# Patient Record
Sex: Female | Born: 1985
Health system: Southern US, Community
[De-identification: ages and names within clinical notes are randomized; demographics above are authoritative.]

## PROBLEM LIST (undated history)

## (undated) DIAGNOSIS — G43909 Migraine, unspecified, not intractable, without status migrainosus: Secondary | ICD-10-CM

## (undated) DIAGNOSIS — Z91010 Allergy to peanuts: Secondary | ICD-10-CM

## (undated) DIAGNOSIS — G932 Benign intracranial hypertension: Secondary | ICD-10-CM

## (undated) DIAGNOSIS — A749 Chlamydial infection, unspecified: Secondary | ICD-10-CM

## (undated) DIAGNOSIS — E669 Obesity, unspecified: Secondary | ICD-10-CM

## (undated) DIAGNOSIS — I10 Essential (primary) hypertension: Secondary | ICD-10-CM

## (undated) DIAGNOSIS — Z22322 Carrier or suspected carrier of Methicillin resistant Staphylococcus aureus: Secondary | ICD-10-CM

## (undated) DIAGNOSIS — IMO0002 Reserved for concepts with insufficient information to code with codable children: Secondary | ICD-10-CM

## (undated) DIAGNOSIS — R87619 Unspecified abnormal cytological findings in specimens from cervix uteri: Secondary | ICD-10-CM

## (undated) HISTORY — DX: Carrier or suspected carrier of methicillin resistant Staphylococcus aureus: Z22.322

## (undated) HISTORY — DX: Chlamydial infection, unspecified: A74.9

## (undated) HISTORY — DX: Obesity, unspecified: E66.9

## (undated) HISTORY — DX: Reserved for concepts with insufficient information to code with codable children: IMO0002

## (undated) HISTORY — DX: Benign intracranial hypertension: G93.2

## (undated) HISTORY — DX: Unspecified abnormal cytological findings in specimens from cervix uteri: R87.619

## (undated) HISTORY — DX: Migraine, unspecified, not intractable, without status migrainosus: G43.909

---
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
## 2007-05-05 ENCOUNTER — Emergency Department (HOSPITAL_COMMUNITY): Admission: EM | Admit: 2007-05-05 | Discharge: 2007-05-05 | Payer: Self-pay | Admitting: Emergency Medicine

## 2007-05-05 DIAGNOSIS — I10 Essential (primary) hypertension: Secondary | ICD-10-CM

## 2009-06-09 ENCOUNTER — Emergency Department (HOSPITAL_COMMUNITY): Admission: EM | Admit: 2009-06-09 | Discharge: 2009-06-09 | Payer: Self-pay | Admitting: Family Medicine

## 2009-08-29 ENCOUNTER — Emergency Department (HOSPITAL_COMMUNITY): Admission: EM | Admit: 2009-08-29 | Discharge: 2009-08-29 | Payer: Self-pay | Admitting: Emergency Medicine

## 2010-01-16 DIAGNOSIS — A749 Chlamydial infection, unspecified: Secondary | ICD-10-CM

## 2010-01-16 HISTORY — DX: Chlamydial infection, unspecified: A74.9

## 2010-04-01 LAB — POCT URINALYSIS DIPSTICK
Hgb urine dipstick: NEGATIVE
Ketones, ur: NEGATIVE mg/dL
Protein, ur: 30 mg/dL — AB
Specific Gravity, Urine: 1.02 (ref 1.005–1.030)
Urobilinogen, UA: 1 mg/dL (ref 0.0–1.0)
pH: 7.5 (ref 5.0–8.0)

## 2010-04-01 LAB — POCT PREGNANCY, URINE: Preg Test, Ur: NEGATIVE

## 2010-04-04 LAB — CULTURE, ROUTINE-ABSCESS

## 2011-04-23 ENCOUNTER — Ambulatory Visit (INDEPENDENT_AMBULATORY_CARE_PROVIDER_SITE_OTHER): Payer: BC Managed Care – PPO | Admitting: Family Medicine

## 2011-04-23 VITALS — BP 126/85 | HR 78 | Temp 98.1°F | Resp 16 | Ht 63.75 in | Wt 232.2 lb

## 2011-04-23 DIAGNOSIS — M79609 Pain in unspecified limb: Secondary | ICD-10-CM

## 2011-04-23 DIAGNOSIS — A4902 Methicillin resistant Staphylococcus aureus infection, unspecified site: Secondary | ICD-10-CM | POA: Insufficient documentation

## 2011-04-23 DIAGNOSIS — L0390 Cellulitis, unspecified: Secondary | ICD-10-CM

## 2011-04-23 DIAGNOSIS — M79659 Pain in unspecified thigh: Secondary | ICD-10-CM

## 2011-04-23 DIAGNOSIS — L0291 Cutaneous abscess, unspecified: Secondary | ICD-10-CM

## 2011-04-23 MED ORDER — DOXYCYCLINE HYCLATE 100 MG PO TABS: 100.0000 mg | ORAL_TABLET | Freq: Two times a day (BID) | ORAL | Status: AC

## 2011-04-23 NOTE — Progress Notes (Signed)
  Subjective:    Patient ID: Karen Ashley, female    DOB: July 01, 1985, 26 y.o.   MRN: [id number]  HPI 26 yo female with cyst on inner thigh for 5 days.  History of cyst/boil under right arm 2 years ago.  Told was not MRSA.  Older sister with one recently that was MRSA.  Started draining Thursday.  Using a cream on it that she got with last boil - iccanthamol.  Painful.  Feels induration is worsening.  Had fever Friday night -103.  Felt well since.    Review of Systems    Negative except as per HPI  Objective:   Physical Exam  Constitutional: She appears well-developed.  Pulmonary/Chest: Effort normal.  Neurological: She is alert.   Left, upper, inner thigh: small opening draining purulent material.  Induration extends up/out about 4-5 cm.  About 1-2 cm wide.  Erythema extends beyond that.  TTP.  Wound culture obtained of draining material.  No inguinal lymphadenopathy appreciated.        Assessment & Plan:  Abscess with cellulitis - given fever, drainage, and very small size of opening, opted to I&D here today.  Doxy BID for 10 days.  Wound culture sent.

## 2011-04-23 NOTE — Progress Notes (Signed)
  Subjective:    Patient ID: Karen Ashley, female    DOB: 1985-05-20, 26 y.o.   MRN: [id number]  HPI    Review of Systems     Objective:   Physical Exam   Procedure Note:  VCO.  Left thigh wound area cleansed with betadine and alcohol.  4 cc of 1% lidocaine with epinephrine injected into lesion.  #11 blade used to open and bloody purulence and small amount of sebaceous material expressed.  Area of induration 10 cm by 5 cm in size.  1/4 inch packing placed and gauze and tegaderm dressing applied.  Wound instructions given.  RTC in 48 hours to see Kennedy Bucker for wound check.     Assessment & Plan:

## 2011-04-24 ENCOUNTER — Ambulatory Visit (INDEPENDENT_AMBULATORY_CARE_PROVIDER_SITE_OTHER): Payer: BC Managed Care – PPO | Admitting: Internal Medicine

## 2011-04-24 VITALS — BP 130/85 | HR 73 | Temp 98.2°F | Resp 16 | Ht 64.0 in | Wt 232.0 lb

## 2011-04-24 DIAGNOSIS — L0292 Furuncle, unspecified: Secondary | ICD-10-CM

## 2011-04-24 DIAGNOSIS — L0291 Cutaneous abscess, unspecified: Secondary | ICD-10-CM

## 2011-04-24 MED ORDER — HYDROCODONE-ACETAMINOPHEN 5-500 MG PO TABS: 1.0000 | ORAL_TABLET | Freq: Four times a day (QID) | ORAL | Status: AC | PRN

## 2011-04-24 NOTE — Progress Notes (Signed)
  Subjective:    Patient ID: Karen Ashley, female    DOB: 07/19/1985, 26 y.o.   MRN: [id number]  HPIReturns for wound care/lots of bleeding last night that she wants rechecked/still in pain/no fever    Review of Systems     Objective:   Physical Exam  Left inner thigh abscess with area of cellulitis that is tender approximately 3 cm x 5 cm/pus still expressible/drained 4 cc of pus/repacked with quarter-inch iodoform      Assessment & Plan:  Abscess thigh  Continue doxycycline Increase hot soaks to 3 or 4 times a day Out of work one to 2 days Vicodin 5 500 if needed #20 Recheck 48 hours if not progressing Awaiting culture

## 2011-04-25 ENCOUNTER — Emergency Department (HOSPITAL_COMMUNITY)
Admission: EM | Admit: 2011-04-25 | Discharge: 2011-04-25 | Disposition: A | Discharge status: Home / Self Care | Payer: BC Managed Care – PPO | Attending: Emergency Medicine | Admitting: Emergency Medicine

## 2011-04-25 ENCOUNTER — Encounter (HOSPITAL_COMMUNITY): Payer: Self-pay | Admitting: *Deleted

## 2011-04-25 DIAGNOSIS — F17200 Nicotine dependence, unspecified, uncomplicated: Secondary | ICD-10-CM | POA: Insufficient documentation

## 2011-04-25 DIAGNOSIS — I10 Essential (primary) hypertension: Secondary | ICD-10-CM | POA: Insufficient documentation

## 2011-04-25 DIAGNOSIS — L03119 Cellulitis of unspecified part of limb: Secondary | ICD-10-CM | POA: Insufficient documentation

## 2011-04-25 DIAGNOSIS — L02419 Cutaneous abscess of limb, unspecified: Secondary | ICD-10-CM | POA: Insufficient documentation

## 2011-04-25 HISTORY — DX: Essential (primary) hypertension: I10

## 2011-04-25 LAB — WOUND CULTURE
Gram Stain: NONE SEEN
Gram Stain: NONE SEEN

## 2011-04-25 NOTE — ED Provider Notes (Signed)
History     CSN: [id number]  Arrival date & time 04/25/11  0807   First MD Initiated Contact with Patient 04/25/11 [phone]      Chief Complaint  Patient presents with  . Leg Pain  . Skin Problem     Patient is a 26 y.o. female presenting with leg pain.  Leg Pain    Patient presents emergency department for evaluation of an abscess.  States she had it drained yesterday at [facility] urgent care.  She states that she feels like the area around the abscesse is more firm at this time.  She states that they gave her antibiotics and pain medication.  He is worried that she may have a medical problem that could kill her and that is what brought her to the emergency department today.  She states that she has had an abscess under her armpit before.  She is concerned that there something else causing these may be harmful to her.  Patient denies weakness, nausea/vomiting, abdominal pain, altered mental status or fevers. Past Medical History  Diagnosis Date  . Hypertension     History reviewed. No pertinent past surgical history.  No family history on file.  History  Substance Use Topics  . Smoking status: Current Some Day Smoker    Last Attempt to Quit: 02/23/2011  . Smokeless tobacco: Not on file  . Alcohol Use: Yes    OB History    Grav Para Term Preterm Abortions TAB SAB Ect Mult Living                  Review of Systems All pertinent positives and negatives reviewed in the history of present illness  Allergies  Review of patient's allergies indicates no known allergies.  Home Medications   Current Outpatient Rx  Name Route Sig Dispense Refill  . AMLODIPINE BESYLATE 5 MG PO TABS Oral Take 5 mg by mouth daily.    Marland Kitchen DOXYCYCLINE HYCLATE 100 MG PO TABS Oral Take 1 tablet (100 mg total) by mouth 2 (two) times daily. 20 tablet 0  . HYDROCODONE-ACETAMINOPHEN 5-500 MG PO TABS Oral Take 1 tablet by mouth every 6 (six) hours as needed for pain. 20 tablet 0    BP 147/108  Pulse 78   Temp(Src) 98.6 F (37 C) (Oral)  Resp 18  Ht 5\' 2"  (1.575 m)  Wt 232 lb (105.235 kg)  BMI 42.43 kg/m2  SpO2 100%  LMP 03/10/2011  Physical Exam  Constitutional: She appears well-developed and well-nourished. She is cooperative.  Non-toxic appearance. She does not have a sickly appearance. She does not appear ill.  HENT:  Head: Normocephalic and atraumatic.  Cardiovascular: Normal rate, regular rhythm and normal heart sounds.   Pulmonary/Chest: Effort normal and breath sounds normal. No respiratory distress.  Musculoskeletal:       Legs: Neurological: She is alert.  Psychiatric: Her mood appears anxious.    ED Course  Procedures (including critical care time)  I advised the patient she should follow up with [facility] urgent care for recheck of this in 2 days.  I advised her that she may use warm compresses around the area and keep the area clean and dry.  Explained that these types of abscesses are common and there is not always a specific cause.  She is very anxious and upset about having had these abscesses is concerned that this could be a life-threatening issue. Patient has only a isolated skin abscess at this time that is draining and is  packed.   MDM          Carlyle Dolly, PA-C 04/25/11 [phone]

## 2011-04-25 NOTE — ED Notes (Signed)
Pt presents to department for evaluation of abscess to L inner thigh area. Ongoing x1 week. States she was seen at [facility], I&D was performed and wound was packed. Now states pain has increased. Also states "the firmness of the wound is worse." packing in place upon arrival, states red/brown colored drainage. 7/10 pain at the time. Was placed on doxycycline and vicodin, pt states no relief of pain. Ambulatory to triage. No signs of acute distress noted.

## 2011-04-25 NOTE — Discharge Instructions (Signed)
You will need to follow up with [facility] in 2 days. Return here as needed. Use heat around the area.       RESOURCE GUIDE  Dental Problems  Patients with Medicaid: [facility] [phone]                                           [phone] [address] Phone:  [phone]                                                  Phone:  [phone]  If unable to pay or uninsured, contact:  [facility] or [facility]. to become qualified for the adult dental clinic.  Chronic Pain Problems Contact [facility]  [phone] Patients need to be referred by their primary care doctor.  Insufficient Money for Medicine Contact [employer]:  call "211" or [facility] 616-410-4697.  No Primary Care Doctor Call [facility]  [phone] Other agencies that provide inexpensive medical care    [facility]  [phone]    [facility]  [phone]    [facility]  [phone]    [facility]  [phone]    [facility]  [phone]    [facility]  [phone]  Psychological Services [facility]  [phone] [facility]  [phone] [facility]   [phone] (emergency services [phone])  Substance Abuse Resources [facility]  [phone] [facility] [phone] [facility] [phone] Floydene Flock [phone] [phone] Residential & Outpatient Substance Abuse Program  332-636-4016  Abuse/Neglect [facility] Child Abuse Hotline 931-814-8160 [facility] Child Abuse Hotline (867)131-2483 (After Hours)  Emergency Shelter [facility] [phone]  Maternity Homes [facility] at the [place] [facility] [place] [phone] [facility] [phone]  MRSA Hotline #:   7697022991    [facility] Resources  [facility]     [employer]                           [facility] Dept. [address] [place]                       [address]      [address]  [place]                                                Cristobal Goldmann Phone:  [phone]                                   Phone:  [phone]                 Phone:  [phone]  [facility] Phone:  [phone]  [facility] Child Abuse Hotline 279-867-4562 [phone] (After Hours)

## 2011-04-25 NOTE — ED Notes (Signed)
Patient has a cyst or abcess on her upper left thigh.  Patient states she noticed the area last week.  She was seen by md and reports her sx are worsening.  She is on antibiotic as well

## 2011-04-25 NOTE — ED Notes (Signed)
Pt discharged. Encouraged to follow up with PCP. Had no further questions.

## 2011-04-27 ENCOUNTER — Ambulatory Visit (INDEPENDENT_AMBULATORY_CARE_PROVIDER_SITE_OTHER): Payer: BC Managed Care – PPO | Admitting: Internal Medicine

## 2011-04-27 VITALS — BP 129/84 | HR 64 | Temp 98.5°F | Resp 16 | Ht 62.0 in | Wt 229.4 lb

## 2011-04-27 DIAGNOSIS — L0390 Cellulitis, unspecified: Secondary | ICD-10-CM

## 2011-04-27 NOTE — Patient Instructions (Signed)
Your wound has MRSA (methillin resistant staph)  Continue on your doxycycline, take it after a meal to decrease nausea.  Return in 2 days for wound care.  Cellulitis Cellulitis is an infection of the skin and the tissue beneath it. The area is typically red and tender. It is caused by germs (bacteria) (usually staph or strep) that enter the body through cuts or sores. Cellulitis most commonly occurs in the arms or lower legs.  HOME CARE INSTRUCTIONS   If you are given a prescription for medications which kill germs (antibiotics), take as directed until finished.   If the infection is on the arm or leg, keep the limb elevated as able.   Use a warm cloth several times per day to relieve pain and encourage healing.   See your caregiver for recheck of the infected site as directed if problems arise.   Only take over-the-counter or prescription medicines for pain, discomfort, or fever as directed by your caregiver.  SEEK MEDICAL CARE IF:   The area of redness (inflammation) is spreading, there are red streaks coming from the infected site, or if a part of the infection begins to turn dark in color.   The joint or bone underneath the infected skin becomes painful after the skin has healed.   The infection returns in the same or another area after it seems to have gone away.   A boil or bump swells up. This may be an abscess.   New, unexplained problems such as pain or fever develop.  SEEK IMMEDIATE MEDICAL CARE IF:   You have a fever.   You or your child feels drowsy or lethargic.   There is vomiting, diarrhea, or lasting discomfort or feeling ill (malaise) with muscle aches and pains.  MAKE SURE YOU:   Understand these instructions.   Will watch your condition.   Will get help right away if you are not doing well or get worse.  Document Released: 10/12/2004 Document Revised: 12/22/2010 Document Reviewed: 08/21/2007 [facility] Patient Information 2012 [place], [place].

## 2011-04-27 NOTE — Progress Notes (Signed)
  Subjective:    Patient ID: Karen Ashley, female    DOB: 01-16-1986, 26 y.o.   MRN: [id number]  HPI  Karen Ashley is here for wound care of her left thigh cyst drained 04/23/11.  She pulled the packing out on 04/24/11 accidentally during a dressing change and came back into [facility] and Dr. Merla Riches repacked.  She feels a larger "lump" in her thigh and has been taking the doxycycline twice daily though it has upset her stomach.  She states she continues to get bloody drainage from her wound.    Review of Systems  Constitutional: Negative for fever and chills.  Eyes: Negative.   Respiratory: Negative.   Cardiovascular: Negative.   Gastrointestinal: Positive for nausea.  Skin: Positive for wound.  All other systems reviewed and are negative.  ROS negative except as noted in HIP     Objective:   Physical Exam  Vitals reviewed. Constitutional: She is oriented to person, place, and time. She appears well-developed and well-nourished.       Obese   Cardiovascular: Normal rate.   Neurological: She is alert and oriented to person, place, and time.  Skin: Skin is warm and dry.   Procedure Note:  Packing and dressing removed from left thigh which showed bloody purulent drainage.  Induration has not decreased as hoped and is tender to palpation.  Irrigated with 5 cc of 1% lidocaine but very little purulence expressed.  Repacked with a small amount of 1/4 inch packing       Assessment & Plan:  Cellulitis/Abcess:  Continue daily dressing changes and may change more frequently if draining, be careful not to pull out packing.  Continue doxy but take after a meal, avoid dairy products.  RTC in 2-3 days for wound care.

## 2011-04-27 NOTE — ED Provider Notes (Signed)
Medical screening examination/treatment/procedure(s) were performed by non-physician practitioner and as supervising physician I was immediately available for consultation/collaboration.   Suzi Roots, MD 04/27/11 [phone]

## 2011-12-06 ENCOUNTER — Emergency Department (HOSPITAL_COMMUNITY)
Admission: EM | Admit: 2011-12-06 | Discharge: 2011-12-06 | Disposition: A | Discharge status: Home / Self Care | Payer: BC Managed Care – PPO | Attending: Emergency Medicine | Admitting: Emergency Medicine

## 2011-12-06 ENCOUNTER — Encounter (HOSPITAL_COMMUNITY): Payer: Self-pay | Admitting: *Deleted

## 2011-12-06 DIAGNOSIS — R21 Rash and other nonspecific skin eruption: Secondary | ICD-10-CM | POA: Insufficient documentation

## 2011-12-06 DIAGNOSIS — R221 Localized swelling, mass and lump, neck: Secondary | ICD-10-CM | POA: Insufficient documentation

## 2011-12-06 DIAGNOSIS — L500 Allergic urticaria: Secondary | ICD-10-CM | POA: Insufficient documentation

## 2011-12-06 DIAGNOSIS — T7840XA Allergy, unspecified, initial encounter: Secondary | ICD-10-CM

## 2011-12-06 DIAGNOSIS — J3489 Other specified disorders of nose and nasal sinuses: Secondary | ICD-10-CM | POA: Insufficient documentation

## 2011-12-06 DIAGNOSIS — R220 Localized swelling, mass and lump, head: Secondary | ICD-10-CM | POA: Insufficient documentation

## 2011-12-06 DIAGNOSIS — I10 Essential (primary) hypertension: Secondary | ICD-10-CM | POA: Insufficient documentation

## 2011-12-06 DIAGNOSIS — Z91010 Allergy to peanuts: Secondary | ICD-10-CM | POA: Insufficient documentation

## 2011-12-06 DIAGNOSIS — F17200 Nicotine dependence, unspecified, uncomplicated: Secondary | ICD-10-CM | POA: Insufficient documentation

## 2011-12-06 DIAGNOSIS — H5789 Other specified disorders of eye and adnexa: Secondary | ICD-10-CM | POA: Insufficient documentation

## 2011-12-06 DIAGNOSIS — R062 Wheezing: Secondary | ICD-10-CM | POA: Insufficient documentation

## 2011-12-06 HISTORY — DX: Allergy to peanuts: Z91.010

## 2011-12-06 MED ORDER — DIPHENHYDRAMINE HCL 50 MG/ML IJ SOLN
12.5000 mg | Freq: Once | INTRAMUSCULAR | Status: AC
  Administered 2011-12-06: 12.5 mg via INTRAVENOUS
  Filled 2011-12-06: qty 1

## 2011-12-06 MED ORDER — PREDNISONE 20 MG PO TABS: 60.0000 mg | ORAL_TABLET | Freq: Every day | ORAL | Status: AC

## 2011-12-06 MED ORDER — FAMOTIDINE IN NACL 20-0.9 MG/50ML-% IV SOLN
20.0000 mg | Freq: Once | INTRAVENOUS | Status: AC
  Administered 2011-12-06: 20 mg via INTRAVENOUS
  Filled 2011-12-06: qty 50

## 2011-12-06 MED ORDER — SODIUM CHLORIDE 0.9 % IV SOLN
Freq: Once | INTRAVENOUS | Status: AC
  Administered 2011-12-06: 13:00:00 via INTRAVENOUS

## 2011-12-06 MED ORDER — DIPHENHYDRAMINE HCL 25 MG PO CAPS: 25.0000 mg | ORAL_CAPSULE | Freq: Four times a day (QID) | ORAL | Status: DC | PRN

## 2011-12-06 MED ORDER — METHYLPREDNISOLONE SODIUM SUCC 125 MG IJ SOLR
125.0000 mg | Freq: Once | INTRAMUSCULAR | Status: AC
  Administered 2011-12-06: 125 mg via INTRAVENOUS
  Filled 2011-12-06: qty 2

## 2011-12-06 MED ORDER — FAMOTIDINE 20 MG PO TABS: 20.0000 mg | ORAL_TABLET | Freq: Two times a day (BID) | ORAL | Status: DC

## 2011-12-06 NOTE — ED Provider Notes (Signed)
Medical screening examination/treatment/procedure(s) were performed by non-physician practitioner and as supervising physician I was immediately available for consultation/collaboration.  Antwane Grose L Nancee Brownrigg, MD 12/06/11 2045 

## 2011-12-06 NOTE — ED Notes (Signed)
Swelling on face diminished. Pt alert and oriented x4. Respirations even and unlabored, bilateral symmetrical rise and fall of chest. Skin warm and dry. In no acute distress. Denies needs.

## 2011-12-06 NOTE — ED Notes (Signed)
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 Pt reports peanut allergy and reports "airway clogging up" from previous exposure. Pts airway intact. pts eyes tearing and nose running at present.

## 2011-12-06 NOTE — ED Notes (Signed)
Pt escorted to discharge window. Pt verbalized understanding discharge instructions. In no acute distress.  

## 2011-12-06 NOTE — ED Provider Notes (Signed)
History     CSN: [id number]  Arrival date & time 12/06/11  1125   First MD Initiated Contact with Patient 12/06/11 1220      Chief Complaint  Patient presents with  . Allergic Reaction    (Consider location/radiation/quality/duration/timing/severity/associated sxs/prior treatment) Patient is a 26 y.o. female presenting with allergic reaction. The history is provided by the patient.  Allergic Reaction The primary symptoms are  wheezing, rash and urticaria. The primary symptoms do not include shortness of breath, nausea or dizziness. The current episode started 1 to 2 hours ago.  Significant symptoms also include eye redness and rhinorrhea. Associated symptoms comments: She has a peanut allergy and came into contact with allergen without ingestion. She reports sudden onset of facial rash and itching, nasal congestion and wheezing. No difficulty swallowing. .    Past Medical History  Diagnosis Date  . Hypertension   . Peanut allergy     History reviewed. No pertinent past surgical history.  History reviewed. No pertinent family history.  History  Substance Use Topics  . Smoking status: Current Some Day Smoker    Last Attempt to Quit: 02/23/2011  . Smokeless tobacco: Not on file  . Alcohol Use: Yes    OB History    Grav Para Term Preterm Abortions TAB SAB Ect Mult Living                  Review of Systems  HENT: Positive for facial swelling and rhinorrhea. Negative for trouble swallowing and neck pain.   Eyes: Positive for redness.  Respiratory: Positive for wheezing. Negative for shortness of breath.   Gastrointestinal: Negative for nausea.  Skin: Positive for rash.  Neurological: Negative for dizziness and light-headedness.    Allergies  Peanuts  Home Medications   Current Outpatient Rx  Name  Route  Sig  Dispense  Refill  . AMLODIPINE BESYLATE 5 MG PO TABS   Oral   Take 5 mg by mouth daily.         Marland Kitchen DOXYCYCLINE HYCLATE 100 MG PO CAPS   Oral   Take  100 mg by mouth 2 (two) times daily. Started on 12-02-11 for 10  days           BP 165/119  Pulse 63  Temp 98.5 F (36.9 C) (Oral)  Resp 15  SpO2 100%  LMP 11/20/2011  Physical Exam  Constitutional: She is oriented to person, place, and time. She appears well-developed and well-nourished.  HENT:  Head: Normocephalic.  Nose: Nose normal.  Mouth/Throat: Oropharynx is clear and moist.       Mild right facial swelling that includes upper and lower eye lid with normo-pigmented maculopapular rash.   Eyes: Conjunctivae normal are normal. Pupils are equal, round, and reactive to light.  Neck: Normal range of motion. Neck supple.  Cardiovascular: Normal rate and regular rhythm.   No murmur heard. Pulmonary/Chest: Effort normal. She has wheezes.       Mild expiratory wheezing, greater on left.  Abdominal: Soft. Bowel sounds are normal. There is no tenderness. There is no rebound and no guarding.  Musculoskeletal: Normal range of motion. She exhibits no edema.  Neurological: She is alert and oriented to person, place, and time.  Skin: Skin is warm and dry. No rash noted.  Psychiatric: She has a normal mood and affect.    ED Course  Procedures (including critical care time) 2:20 - Recheck after medications. She is resting. Less facial swelling and wheezing has resolved. Will  continue to observe for 1-2 additional hours.   3:30 - still without new symptoms.  Labs Reviewed - No data to display No results found.   No diagnosis found.  1. Allergic reaction  MDM  Patient's symptoms resolved without recurrence during observation period. Stable for discharge.         Rodena Medin, PA-C 12/06/11 1536

## 2011-12-06 NOTE — ED Notes (Signed)
Pt reports she fells better and her throat is not as scratchy

## 2012-01-30 ENCOUNTER — Ambulatory Visit: Payer: BC Managed Care – PPO | Admitting: Obstetrics and Gynecology

## 2012-01-30 DIAGNOSIS — B009 Herpesviral infection, unspecified: Secondary | ICD-10-CM | POA: Insufficient documentation

## 2012-01-30 DIAGNOSIS — Z331 Pregnant state, incidental: Secondary | ICD-10-CM

## 2012-01-31 LAB — PRENATAL PANEL VII
Antibody Screen: NEGATIVE
Eosinophils Absolute: 0.4 10*3/uL (ref 0.0–0.7)
Eosinophils Relative: 6 % — ABNORMAL HIGH (ref 0–5)
HCT: 38.1 % (ref 36.0–46.0)
Lymphocytes Relative: 34 % (ref 12–46)
Lymphs Abs: 2 10*3/uL (ref 0.7–4.0)
MCH: 28.2 pg (ref 26.0–34.0)
MCV: 84.7 fL (ref 78.0–100.0)
Monocytes Absolute: 0.4 10*3/uL (ref 0.1–1.0)
RDW: 13.7 % (ref 11.5–15.5)
Rh Type: POSITIVE
Rubella: 2.23 Index — ABNORMAL HIGH (ref <0.90)
WBC: 5.9 10*3/uL (ref 4.0–10.5)

## 2012-01-31 LAB — HSV 1 ANTIBODY, IGG: HSV 1 Glycoprotein G Ab, IgG: 11.6 IV — ABNORMAL HIGH

## 2012-01-31 LAB — HSV 2 ANTIBODY, IGG: HSV 2 Glycoprotein G Ab, IgG: <0.1 IV

## 2012-02-01 ENCOUNTER — Encounter: Payer: Self-pay | Admitting: Obstetrics and Gynecology

## 2012-02-01 DIAGNOSIS — A749 Chlamydial infection, unspecified: Secondary | ICD-10-CM | POA: Insufficient documentation

## 2012-02-01 DIAGNOSIS — G43909 Migraine, unspecified, not intractable, without status migrainosus: Secondary | ICD-10-CM | POA: Insufficient documentation

## 2012-02-01 LAB — CULTURE, OB URINE: Organism ID, Bacteria: NO GROWTH

## 2012-02-01 LAB — HEMOGLOBINOPATHY EVALUATION: Hgb A: 97.2 % (ref 96.8–97.8)

## 2012-02-01 NOTE — Progress Notes (Signed)
NOB interview completed.  PNV samples given.  NOB work up scheduled for Thursday 03/07/12 w/ [facility].

## 2012-03-07 ENCOUNTER — Ambulatory Visit: Payer: BC Managed Care – PPO

## 2012-03-07 VITALS — BP 140/70 | Wt 236.0 lb

## 2012-03-07 DIAGNOSIS — E669 Obesity, unspecified: Secondary | ICD-10-CM

## 2012-03-07 DIAGNOSIS — A4902 Methicillin resistant Staphylococcus aureus infection, unspecified site: Secondary | ICD-10-CM

## 2012-03-07 DIAGNOSIS — Z331 Pregnant state, incidental: Secondary | ICD-10-CM

## 2012-03-07 HISTORY — DX: Obesity, unspecified: E66.9

## 2012-03-07 LAB — OB RESULTS CONSOLE GC/CHLAMYDIA
Chlamydia: NEGATIVE
Gonorrhea: NEGATIVE

## 2012-03-07 NOTE — Progress Notes (Signed)
[redacted]w[redacted]d After 5 mins R arm BP 138/76 Pt wants to discuss BP meds during pregnancy  Pt wants genetic screening  Last pap completed Aug. 2013 WNL per  Pt declines flu shot

## 2012-03-07 NOTE — Progress Notes (Signed)
..   Subjective:    Karen Ashley is being seen today for her first obstetrical visit.  She is [redacted]w[redacted]d determined by: Patient's last menstrual period was 12/20/2011.Marland Kitchen  Ultrasound: NO  Relationship w FOB: single "Cleda Clarks"  She denies any vag bleeding, cramping, or discharge.  She denies nausea/vomiting.  HTN dx'd in ED 05/05/07 when being seen for headache.  Her obstetrical history is significant for: 1. CHTN 2. H/o migraines 3. HSV-1 pos w/ NOB labs 4. Stopped Marijuana 01/22/12 5. H/o MRSA Lt inner thigh 04/23/11 6. Obese 7. H/o abnl pap  Review of Systems Pertinent ROS is described in HPI  Allergies  Allergen Reactions  . Peanuts [Peanut Oil] Other (See Comments)    Shortness of breath   .Marland Kitchen Past Medical History  Diagnosis Date  . Peanut allergy   . Hypertension   . Migraines     Typcially takes Excedrin or BC powder  . Abnormal Pap smear     Had rpt pap;was normal;Last pap 08/2011 was normal  . MRSA (methicillin resistant staph aureus) culture positive     Was rx'd doxycycline x 2;found under arm and inner thigh  . Chlamydia 2012    Was treated and partner treated  .Marland Kitchen Past Surgical History  Procedure Laterality Date  . Wisdom tooth extraction  2006    All 4 removed  . Tonsillectomy  2007   Objective:   BP 140/70  Wt 236 lb (107.049 kg)  BMI 43.15 kg/m2  LMP 12/20/2011 Wt Readings from Last 1 Encounters:  03/25/12 238 lb (107.956 kg)  repeat BP 5 min later=138/76  BMI: Body mass index is 43.15 kg/(m^2).  General: alert, cooperative and no distress HEENT: grossly normal  Thyroid: normal  Respiratory: clear to auscultation bilaterally Cardiovascular: regular rate and rhythm  Breasts:  No dominant masses, nipples erect Gastrointestinal: soft, non-tender; no masses,  no organomegaly Extremities: extremities normal, no pain or edema  EXTERNAL GENITALIA: normal appearing vulva with no masses, tenderness or lesions VAGINA: no abnormal discharge, bleeding or  lesions CERVIX: no lesions or cervical motion tenderness; cervix closed, long, firm UTERUS: gravid and consistent with 10-12 weeks ADNEXA: no masses palpable and nontender OB EXAM PELVIMETRY: appears adequate   Assessment:    Primagravida at [redacted]w[redacted]d CHTN Obese H/o MRSA 4/'13 Lt thigh Pos HSV-1 titer 01/30/12  Plan:     Prenatal labs rv'd; yes; Hgb Elec-nml; pos HSV-1 titer; HSV-2 neg Pap smear collected:  No; pt reports nml 8/'13 GC/Chlamydia collected:  yes Wet prep:  Not done  Discussion of Genetic testing options: desires 1st trimester screen Problem list reviewed and updated. rv'd how and when to call for emergencies rv'd practice routines Discussed nutrition and exercise and common pregnancy discomforts  Plan monthly growth u/s and at 32 weeks, antenatal testing; needs baseline 24 hr urine/PIH labs CTO BP closely r/e medication in pregnancy  F/u 1-2 weeks for 1st trimester screen; 4 weeks for ROB  C. Denny Levy, CNM

## 2012-03-08 ENCOUNTER — Other Ambulatory Visit: Payer: Self-pay

## 2012-03-08 DIAGNOSIS — Z36 Encounter for antenatal screening of mother: Secondary | ICD-10-CM

## 2012-03-08 LAB — GC/CHLAMYDIA PROBE AMP
CT Probe RNA: NEGATIVE
GC Probe RNA: NEGATIVE

## 2012-03-14 ENCOUNTER — Other Ambulatory Visit: Payer: Self-pay | Admitting: Obstetrics and Gynecology

## 2012-03-14 ENCOUNTER — Ambulatory Visit: Payer: BC Managed Care – PPO

## 2012-03-14 DIAGNOSIS — Z36 Encounter for antenatal screening of mother: Secondary | ICD-10-CM

## 2012-03-20 ENCOUNTER — Telehealth: Payer: Self-pay | Admitting: Obstetrics and Gynecology

## 2012-03-20 NOTE — Telephone Encounter (Signed)
Tc to pt 13w pregnant c/o cold sxs and wants to know what is safe to take. Advised pt to try mucinex, plain sudafed, or robitussin to help with cough. Pt states she feels extremely cold even when wearing a jacket, advised to check temp and to call office back if it is greater than 100.4. Also advised pt to increase water intake, states she currently only drinks about one 20 oz bottle per day. Pt voiced understanding.

## 2012-03-25 ENCOUNTER — Encounter: Payer: Self-pay | Admitting: Obstetrics and Gynecology

## 2012-03-25 ENCOUNTER — Ambulatory Visit: Payer: BC Managed Care – PPO | Admitting: Family Medicine

## 2012-03-25 ENCOUNTER — Telehealth: Payer: Self-pay | Admitting: Obstetrics and Gynecology

## 2012-03-25 VITALS — BP 110/60 | Temp 97.5°F | Wt 238.0 lb

## 2012-03-25 DIAGNOSIS — J0100 Acute maxillary sinusitis, unspecified: Secondary | ICD-10-CM

## 2012-03-25 MED ORDER — AMOXICILLIN 500 MG PO CAPS: 500.0000 mg | ORAL_CAPSULE | Freq: Three times a day (TID) | ORAL | Status: AC

## 2012-03-25 MED ORDER — CETIRIZINE HCL 10 MG PO TABS: 10.0000 mg | ORAL_TABLET | Freq: Every day | ORAL | Status: DC

## 2012-03-25 NOTE — Telephone Encounter (Signed)
Apt scheduled to see LC, NP today 03/25/2012 @ 3:00 P.M. Pt states that she has had sx x 1 week, has taken OTC robitussin but is not helping.   Darien Ramus, CMA

## 2012-03-25 NOTE — Progress Notes (Signed)
[redacted]w[redacted]d Pt complains of sore throat, sinus and chest congestion. Hoarse this am. OTC med not helping.

## 2012-03-25 NOTE — Progress Notes (Signed)
[redacted]w[redacted]d S: c/o of nasal congestion and facial pressure x 1 weeks. Tried OTC Robitussin with no relief.  Now blowing nose with bleeding and coughing with thick yellowish mucous and chest pressure.  Denies fevers, chills, N/V/D, but frontal headache.  Decreased appetite, but tolerating fluids.  Hx of Allergies took benadryl/zytec, but no current treatment.  Denies CP/SOB. O: Afrebile, patient sitting on exam table, appears sick, but nontoxic.  Head normocephalic, eyeglasses, eye watery, conjunctiva clear and avascular.  TM bulging bilaterally without redness or discharge.  Cone of light present and appropriate.  Nares patent bilaterally, mucosa red, inferior turbinates swollen, bilaterally and dried blood noted on right septum.  Lips moisture, teeth present, tongue without fissures.  Pharynx red without exudate, tonsils absents, postnasal drip noted. Neck supple. Maxiallar tenderness noted.  No lymphadenopathy in occipital, peri/post auricular, tonsillar, cervical or submental areas. Lungs CTAB, Chest S1+S2 present without M/G/R. A: Acute Sinusitis     Seasonal Allergies  P: Amoxicillin 500mg  TID x 10 days, no refills.       Zyrtec 10 mg daily. ROB as scheduled.  L.Carter, FNP-BC

## 2012-04-11 ENCOUNTER — Encounter: Payer: Self-pay | Admitting: Certified Nurse Midwife

## 2012-04-11 ENCOUNTER — Other Ambulatory Visit: Payer: Self-pay | Admitting: Certified Nurse Midwife

## 2012-09-20 ENCOUNTER — Encounter: Payer: Self-pay | Admitting: Obstetrics and Gynecology

## 2012-09-20 ENCOUNTER — Encounter (HOSPITAL_COMMUNITY): Payer: Self-pay | Admitting: *Deleted

## 2012-09-20 ENCOUNTER — Telehealth (HOSPITAL_COMMUNITY): Payer: Self-pay | Admitting: *Deleted

## 2012-09-20 NOTE — Telephone Encounter (Signed)
Preadmission screen  

## 2012-09-25 ENCOUNTER — Encounter (HOSPITAL_COMMUNITY): Payer: Self-pay | Admitting: Obstetrics

## 2012-09-25 ENCOUNTER — Inpatient Hospital Stay (HOSPITAL_COMMUNITY)
Admission: RE | Admit: 2012-09-25 | Discharge: 2012-09-25 | Disposition: A | Discharge status: Home / Self Care | Payer: Medicaid Other | Source: Ambulatory Visit | Attending: Obstetrics and Gynecology | Admitting: Obstetrics and Gynecology

## 2012-09-25 ENCOUNTER — Inpatient Hospital Stay (HOSPITAL_COMMUNITY)
Admission: AD | Admit: 2012-09-25 | Discharge: 2012-10-01 | DRG: 765 | Disposition: A | Discharge status: Home / Self Care | Payer: Medicaid Other | Source: Ambulatory Visit | Attending: Obstetrics and Gynecology | Admitting: Obstetrics and Gynecology

## 2012-09-25 DIAGNOSIS — O1002 Pre-existing essential hypertension complicating childbirth: Principal | ICD-10-CM | POA: Diagnosis present

## 2012-09-25 DIAGNOSIS — B009 Herpesviral infection, unspecified: Secondary | ICD-10-CM

## 2012-09-25 DIAGNOSIS — D649 Anemia, unspecified: Secondary | ICD-10-CM | POA: Diagnosis not present

## 2012-09-25 DIAGNOSIS — Z6841 Body Mass Index (BMI) 40.0 and over, adult: Secondary | ICD-10-CM

## 2012-09-25 DIAGNOSIS — A4902 Methicillin resistant Staphylococcus aureus infection, unspecified site: Secondary | ICD-10-CM

## 2012-09-25 DIAGNOSIS — I10 Essential (primary) hypertension: Secondary | ICD-10-CM

## 2012-09-25 DIAGNOSIS — O9903 Anemia complicating the puerperium: Secondary | ICD-10-CM | POA: Diagnosis not present

## 2012-09-25 DIAGNOSIS — A749 Chlamydial infection, unspecified: Secondary | ICD-10-CM

## 2012-09-25 DIAGNOSIS — G43909 Migraine, unspecified, not intractable, without status migrainosus: Secondary | ICD-10-CM

## 2012-09-25 DIAGNOSIS — E669 Obesity, unspecified: Secondary | ICD-10-CM | POA: Diagnosis present

## 2012-09-25 LAB — PROTEIN / CREATININE RATIO, URINE
Protein Creatinine Ratio: 0.08 (ref 0.00–0.15)
Total Protein, Urine: 4.2 mg/dL

## 2012-09-25 LAB — CBC
HCT: 36.8 % (ref 36.0–46.0)
MCV: 83.1 fL (ref 78.0–100.0)
RBC: 4.43 MIL/uL (ref 3.87–5.11)
WBC: 11.6 10*3/uL — ABNORMAL HIGH (ref 4.0–10.5)

## 2012-09-25 LAB — COMPREHENSIVE METABOLIC PANEL
Albumin: 2.6 g/dL — ABNORMAL LOW (ref 3.5–5.2)
Alkaline Phosphatase: 149 U/L — ABNORMAL HIGH (ref 39–117)
BUN: 6 mg/dL (ref 6–23)
Chloride: 103 mEq/L (ref 96–112)
GFR calc Af Amer: >90 mL/min (ref >90)
Glucose, Bld: 97 mg/dL (ref 70–99)
Potassium: 3.7 mEq/L (ref 3.5–5.1)
Total Bilirubin: 0.2 mg/dL — ABNORMAL LOW (ref 0.3–1.2)

## 2012-09-25 MED ORDER — LACTATED RINGERS IV SOLN
500.0000 mL | INTRAVENOUS | Status: DC | PRN
  Administered 2012-09-26: 500 mL via INTRAVENOUS

## 2012-09-25 MED ORDER — ONDANSETRON HCL 4 MG/2ML IJ SOLN: 4.0000 mg | Freq: Four times a day (QID) | INTRAMUSCULAR | Status: DC | PRN

## 2012-09-25 MED ORDER — OXYTOCIN 40 UNITS IN LACTATED RINGERS INFUSION - SIMPLE MED
62.5000 mL/h | INTRAVENOUS | Status: DC
  Filled 2012-09-25: qty 1000

## 2012-09-25 MED ORDER — CITRIC ACID-SODIUM CITRATE 334-500 MG/5ML PO SOLN
30.0000 mL | ORAL | Status: DC | PRN
  Administered 2012-09-28: 30 mL via ORAL
  Filled 2012-09-25: qty 15

## 2012-09-25 MED ORDER — OXYTOCIN BOLUS FROM INFUSION: 500.0000 mL | INTRAVENOUS | Status: DC

## 2012-09-25 MED ORDER — IBUPROFEN 600 MG PO TABS: 600.0000 mg | ORAL_TABLET | Freq: Four times a day (QID) | ORAL | Status: DC | PRN

## 2012-09-25 MED ORDER — LIDOCAINE HCL (PF) 1 % IJ SOLN
30.0000 mL | INTRAMUSCULAR | Status: DC | PRN
  Filled 2012-09-25: qty 30

## 2012-09-25 MED ORDER — ZOLPIDEM TARTRATE 5 MG PO TABS
5.0000 mg | ORAL_TABLET | Freq: Every evening | ORAL | Status: DC | PRN
  Administered 2012-09-25: 5 mg via ORAL
  Filled 2012-09-25: qty 1

## 2012-09-25 MED ORDER — LACTATED RINGERS IV SOLN
INTRAVENOUS | Status: DC
  Administered 2012-09-25 – 2012-09-28 (×8): via INTRAVENOUS

## 2012-09-25 MED ORDER — OXYCODONE-ACETAMINOPHEN 5-325 MG PO TABS: 1.0000 | ORAL_TABLET | ORAL | Status: DC | PRN

## 2012-09-25 MED ORDER — ACETAMINOPHEN 325 MG PO TABS: 650.0000 mg | ORAL_TABLET | ORAL | Status: DC | PRN

## 2012-09-25 MED ORDER — MISOPROSTOL 25 MCG QUARTER TABLET
25.0000 ug | ORAL_TABLET | ORAL | Status: DC | PRN
  Administered 2012-09-25 – 2012-09-26 (×2): 25 ug via VAGINAL
  Filled 2012-09-25 (×2): qty 0.25

## 2012-09-25 MED ORDER — OXYTOCIN 40 UNITS IN LACTATED RINGERS INFUSION - SIMPLE MED
1.0000 m[IU]/min | INTRAVENOUS | Status: DC
  Administered 2012-09-26 – 2012-09-27 (×2): 1 m[IU]/min via INTRAVENOUS
  Administered 2012-09-27: 4 m[IU]/min via INTRAVENOUS
  Filled 2012-09-25 (×2): qty 1000

## 2012-09-25 NOTE — H&P (Signed)
Karen Ashley is a 27 y.o. female presenting for IOL at 67wks. She denies any ctx, VB or LOF, +FM. She denies HA currently, but had one earlier today. She denies N/V/RUQ pain or visual changes. She does report bilateral LEE, 2-3+ pitting.   Pregnancy significant for: 1. CHTN - on meds pre-pregnancy, none during preg 2. Hx chlamydia 2012 3. +HSV 1 titer, neg HSV 2 titer 4. Elevated BMI 5. Hx MRSA   HPI: Pt began PNC at [facility] at 11wks, EDC determined by LMP =09/25/12 1st trim screen neg, [place] c/s LMP dating AFP neg Anatomy US at 18wks WNL [place] at 31wks for S>D, EFW 77% otherwise normal  [place] at 39wks, EFW 8#9oz, BPP 8/8 Early 1hr gtt at 21wks normal Routine 1hr gtt normal  LEE noted at 37wks BP remained normal until today, noted to be slightly elevated in the office  130's/90's   Maternal Medical History:  Reason for admission: Nausea. IOL   Contractions: Frequency: irregular.   Perceived severity is mild.    Fetal activity: Perceived fetal activity is normal.   Last perceived fetal movement was within the past hour.    Prenatal complications: PIH.   CHTN, on meds prior to preg  Prenatal Complications - Diabetes: none.    OB History   Grav Para Term Preterm Abortions TAB SAB Ect Mult Living   1              Past Medical History  Diagnosis Date  . Peanut allergy   . Hypertension   . Migraines     Typcially takes Excedrin or BC powder  . Abnormal Pap smear     Had rpt pap;was normal;Last pap 08/2011 was normal  . MRSA (methicillin resistant staph aureus) culture positive     Was rx'd doxycycline x 2;found under arm and inner thigh  . Chlamydia 2012    Was treated and partner treated  . Obese 03/07/2012   Past Surgical History  Procedure Laterality Date  . Wisdom tooth extraction  2006    All 4 removed  . Tonsillectomy  2007   Family History: family history includes Anemia in her sister; Cancer in her maternal grandmother; Diabetes type I in her other; Diabetes type II in  her maternal grandfather; Fibroids in her mother; Hypertension in her father, mother, and sister; Migraines in her mother and sister. Social History:  reports that she has never smoked. She has never used smokeless tobacco. She reports that  drinks alcohol. She reports that she uses illicit drugs (Marijuana).   Prenatal Transfer Tool  Maternal Diabetes: No Genetic Screening: Normal Maternal Ultrasounds/Referrals: Normal Fetal Ultrasounds or other Referrals:  None Maternal Substance Abuse:  No Significant Maternal Medications:  None Significant Maternal Lab Results:  Lab values include: Group B Strep negative Other Comments:  None  Review of Systems  Respiratory: Negative for shortness of breath.   Cardiovascular: Negative for chest pain.  Gastrointestinal: Negative for nausea, vomiting and abdominal pain.  Musculoskeletal:       LEE  Neurological: Positive for headaches.  All other systems reviewed and are negative.      Last menstrual period 12/20/2011. Maternal Exam:  Uterine Assessment: Contraction strength is mild.  Contraction frequency is irregular.   Abdomen: Patient reports no abdominal tenderness. Fundal height is aga.   Estimated fetal weight is 8-9#.   Fetal presentation: vertex  Introitus: Normal vulva. Normal vagina.  Pelvis: adequate for delivery.   Cervix: Cervix evaluated by digital exam.  Fetal Exam Fetal Monitor Review: Mode: ultrasound.   Baseline rate: 130.  Variability: moderate (6-25 bpm).   Pattern: accelerations present and no decelerations.    Fetal State Assessment: Category I - tracings are normal.     Physical Exam  Nursing note and vitals reviewed. Constitutional: She is oriented to person, place, and time. She appears well-developed and well-nourished.  HENT:  Head: Normocephalic.  Eyes: Pupils are equal, round, and reactive to light.  Neck: Normal range of motion.  Cardiovascular: Normal rate, regular rhythm and normal heart  sounds.   Respiratory: Effort normal and breath sounds normal.  GI: Soft. Bowel sounds are normal.  Genitourinary: Vagina normal.  Musculoskeletal: Normal range of motion. She exhibits edema.  Neurological: She is alert and oriented to person, place, and time. She has normal reflexes.  Skin: Skin is warm and dry.  Psychiatric: She has a normal mood and affect. Her behavior is normal.    Prenatal labs: ABO, Rh: O/POS/-- (01/14 1028) Antibody: NEG (01/14 1028) Rubella: 2.23 (01/14 1028) RPR: NON REAC (01/14 1028)  HBsAg: NEGATIVE (01/14 1028)  HIV: NON REACTIVE (01/14 1028)  GBS:   neg 9/6  GC/CT neg 9/6 GC/CT neg 2/20 hgb electrophoresis normal 1/14 HSV 1 titer pos 1/14 HSV 2 titer neg 1/14  UA cx neg 1/14 1st trim screen WNL 2/27 AFP neg 4/10 Early 1hr gtt =73 5/6  1hr gtt =99, hgb 12.5, RPR NR 6/18     Assessment/Plan: IUP at 40wks GBS neg FHR reassuring Unfavorable cervix CHTN,  on meds prior to preg  Admit to b.s per c/w Dr Estanislado Pandy Routine L&D Orders cytotec PV q4h  Pitocin when appropriate Pain meds PRN  ambien for sleep  CEFM SL overnight Reg diet until pitocin started    Karen Ashley M 09/25/2012, 7:42 PM

## 2012-09-26 LAB — CBC
MCH: 29.1 pg (ref 26.0–34.0)
MCHC: 35 g/dL (ref 30.0–36.0)
MCV: 83 fL (ref 78.0–100.0)
Platelets: 220 10*3/uL (ref 150–400)
RDW: 14.7 % (ref 11.5–15.5)

## 2012-09-26 MED ORDER — ZOLPIDEM TARTRATE 5 MG PO TABS: 5.0000 mg | ORAL_TABLET | Freq: Once | ORAL | Status: DC

## 2012-09-26 NOTE — Consult Note (Signed)
27 y.o. year old female,at [redacted]w[redacted]d gestation.  SUBJECTIVE:  Contractions are the same.  OBJECTIVE:  BP 121/83  Pulse 87  Temp(Src) 98.9 F (37.2 C) (Oral)  Resp 22  Ht 5\' 2"  (1.575 m)  Wt 280 lb (127.007 kg)  BMI 51.2 kg/m2  LMP 12/20/2011  Fetal Heart Tones:  Cat 1  Contractions:          mild  CX: 2-3/50/-3,-4  Foley bulb out.  ASSESSMENT:  [redacted]w[redacted]d Weeks Pregnancy  HTN  PLAN:  Will allow the patient to sleep tonight. Start Pit at 6:00 am.  Leonard Schwartz, M.D.

## 2012-09-26 NOTE — Progress Notes (Signed)
Pt requesting to ambulate hall way. Ok per [employer]. Will get NST in 20 minutes.

## 2012-09-26 NOTE — Progress Notes (Signed)
Patient ID: Karen Ashley, female   DOB: Jan 31, 1985, 27 y.o.   MRN: [id number] Karen Ashley is a 27 y.o. G1P0 at [redacted]w[redacted]d admitted for IOL for CHTN  Subjective: Feels some cramping, slept some overnight,   Objective: BP 131/67  Pulse 92  Temp(Src) 98.8 F (37.1 C) (Oral)  Resp 18  Ht 5\' 2"  (1.575 m)  Wt 280 lb (127.007 kg)  BMI 51.2 kg/m2  LMP 12/20/2011     FHT:  FHR: 140 bpm, variability: moderate,  accelerations:  Present,  decelerations:  Absent UC:   irregular, every 1-5 minutes SVE:   Dilation: 1.5 Effacement (%): 50 Station: -3 Exam by:: s. Elda Dunkerson, cnm     Assessment / Plan: IOL, rcv'd 2 doses of cytotec overnight  Labor: cervix not favorable,  Preeclampsia:  labs and PCR normal, BP stable Fetal Wellbeing:  Category I Pain Control:  n/a Anticipated MOD:  NSVD  Pt desires to shower and have breakfast Will then consider foley bulb placement and/or pitocin initiation   Report to Dr Stefano Gaul to continue care    Antonio Woodhams M 09/26/2012, 8:04 AM

## 2012-09-26 NOTE — Progress Notes (Signed)
Cx 1.5 cm FHT: Cat 1 Foley bulb placed. Continue Pit.  Dr. Stefano Gaul

## 2012-09-26 NOTE — Progress Notes (Signed)
  Subjective: Called to [facility] because pt would like to discuss POC.  POC was to turn off Pitocin, allow pt to rest through the night and restart Pitocin at 0600 per Dr. Stefano Gaul.  Pt is anxious to keep going with IOL because she wants to have her baby, she didn't want to rest through the night.  Discussed the thought behind the POC.  Pt voiced understanding.  Pt, FOB and I discussed the option of walking/birth ball till midnight then SVE.  After SVE pt will lay down and rest for the rest of the night.  Objective: BP 128/73  Pulse 87  Temp(Src) 98.6 F (37 C) (Oral)  Resp 20  Ht 5\' 2"  (1.575 m)  Wt 280 lb (127.007 kg)  BMI 51.2 kg/m2  LMP 12/20/2011      FHT:  Cat I UC:   none  SVE:   Deferred until 0000  Assessment / Plan:  Labor: IOL for CHTN; Pitocin off, plan to restart at 0600 Preeclampsia: no s/s Fetal Wellbeing: Cat I Pain Control: None needed at this time I/D: GBS neg; Intact; Afibrile Anticipated MOD: SVD   Karen Ashley 09/26/2012, 10:18 PM

## 2012-09-27 MED ORDER — EPHEDRINE 5 MG/ML INJ: 10.0000 mg | INTRAVENOUS | Status: DC | PRN

## 2012-09-27 MED ORDER — PHENYLEPHRINE 40 MCG/ML (10ML) SYRINGE FOR IV PUSH (FOR BLOOD PRESSURE SUPPORT): 80.0000 ug | PREFILLED_SYRINGE | INTRAVENOUS | Status: DC | PRN

## 2012-09-27 MED ORDER — LACTATED RINGERS IV SOLN: 500.0000 mL | Freq: Once | INTRAVENOUS | Status: DC

## 2012-09-27 MED ORDER — DIPHENHYDRAMINE HCL 50 MG/ML IJ SOLN: 12.5000 mg | INTRAMUSCULAR | Status: DC | PRN

## 2012-09-27 MED ORDER — BUTORPHANOL TARTRATE 1 MG/ML IJ SOLN: 2.0000 mg | INTRAMUSCULAR | Status: DC | PRN

## 2012-09-27 MED ORDER — FENTANYL 2.5 MCG/ML BUPIVACAINE 1/10 % EPIDURAL INFUSION (WH - ANES): 14.0000 mL/h | INTRAMUSCULAR | Status: DC | PRN

## 2012-09-27 NOTE — Progress Notes (Addendum)
  Subjective: Pt napping.  Denies UCs.  Objective: BP 126/59  Pulse 86  Temp(Src) 99 F (37.2 C) (Oral)  Resp 20  Ht 5\' 2"  (1.575 m)  Wt 280 lb (127.007 kg)  BMI 51.2 kg/m2  LMP 12/20/2011      FHT:  Cat II UC:   Occasional  SVE:   Dilation: 2.5 Effacement (%): 50 Station: -3 Exam by:: Athan Casalino CNM  Assessment / Plan:  Labor: IOL for CHTN; Plan to restart Pitocin at 0600  Preeclampsia: no s/s  Fetal Wellbeing: Cat II Pain Control: None needed at this time  I/D: GBS neg; Intact; Afibrile  Anticipated MOD: SVD    Frisco Cordts 09/27/2012, 12:08 AM

## 2012-09-27 NOTE — Progress Notes (Signed)
Patient ID: Karen Ashley, female   DOB: 1985-08-27, 27 y.o.   MRN: [id number] Called by Eunice Blase, RN Pt went to bathroom and foley fell out.  She is still not requesting pain medicine. VE per RN 3-4/50/-3 vtx palpated by me earlier and high Cat 1 tracing Ctxs q Continue to titrate pitocin per protocol

## 2012-09-27 NOTE — Progress Notes (Signed)
Pt still not feeling ctxs Cat 1 tracing Toco q2-65min Foley balloon placed with 60cc inflated in balloon Cervix unchanged A/P P0 at term undergoing induction secondary to [facility]

## 2012-09-27 NOTE — Progress Notes (Signed)
  Subjective: Pt was able to rest through the night.  Ready for pitocin to start.  Objective: BP 118/74  Pulse 74  Temp(Src) 98.3 F (36.8 C) (Oral)  Resp 20  Ht 5\' 2"  (1.575 m)  Wt 280 lb (127.007 kg)  BMI 51.2 kg/m2  LMP 12/20/2011      FHT:  Cat I UC:   Occasional  SVE:   Dilation: 2.5 Effacement (%): 50 Station: -3 Exam by:: H Stone RN  Assessment / Plan:  Labor: IOL for [employer]; Pitocin started at 0600  Preeclampsia: no s/s  Fetal Wellbeing: Cat I Pain Control: None needed at this time  I/D: GBS neg; Intact; Afibrile  Anticipated MOD: SVD   Bedelia Pong 09/27/2012, 6:12 AM

## 2012-09-27 NOTE — Progress Notes (Signed)
Karen Ashley is a 27 y.o. G1P0 at [redacted]w[redacted]d admitted for induction of labor due to Hypertension.  Subjective: Denies feeling any contractions.  Denies HA, visual changes or abdominal pain.  Objective: BP 140/76  Pulse 83  Temp(Src) 98.3 F (36.8 C) (Oral)  Resp 18  Ht 5\' 2"  (1.575 m)  Wt 127.007 kg (280 lb)  BMI 51.2 kg/m2  LMP 12/20/2011      FHT:  FHR: 130s bpm, variability: moderate,  accelerations:  Present,  decelerations:  Absent UC:   regular, every 2-3 minutes SVE:   Dilation: 2 Effacement (%): 50 Station: -3 Exam by:: Dr. Su Hilt  Labs: Lab Results  Component Value Date   WBC 13.0* 09/26/2012   HGB 13.2 09/26/2012   HCT 37.7 09/26/2012   MCV 83.0 09/26/2012   PLT 220 09/26/2012    Assessment / Plan: Induction of labor due to hypertension,  not feeling any contractions on 19mu of pitocin.  Will change bag of pitocin.  Pt felt more contractions yesterday.  I think I can possibly get foley in again and if changing the pitocin bag does not help, I will consider replacing foley in cervix.  Labor: continue to titrate pitocin per protocol Preeclampsia:  no signs or symptoms of toxicity Fetal Wellbeing:  Category I Pain Control:  not needed yet Anticipated MOD:  If can get into active labor, will anticipate NSVD  Maren Wiesen Y 09/27/2012, 1:00 PM

## 2012-09-27 NOTE — Progress Notes (Signed)
  Subjective: Aware of occasional contraction, but no pain.  On clear liquid diet.  Objective: BP 141/73  Pulse 88  Temp(Src) 98.5 F (36.9 C) (Oral)  Resp 20  Ht 5\' 2"  (1.575 m)  Wt 280 lb (127.007 kg)  BMI 51.2 kg/m2  LMP 12/20/2011      FHT:  Category 1 UC:   regular, every 2-4 minutes, very mild SVE:   Dilation: 4 Effacement (%): 60 Station: -3 Exam by:: V Kayton Dunaj CNM Fetus is out of the pelvis--bedside US done to verify vtx position  Assessment / Plan: IUP at 40 2/7 weeks Induction for chronic hypertension No progression into labor--s/p Cytotech, foley x 2, pitocin x 2 days. High vtx position  Plan: Reviewed status with Dr. Estanislado Pandy. Offered patient options of stopping induction and planning primary C/S or continuing pitocin to max of 40 mu/min, with re-evaluation for possible AROM if vtx descends.  I do not recommend needling membranes at present, even under a double set-up, since vtx is completely out of the pelvis. R&B of each option reviewed with patient--she hopes to avoid a C/S, therefore she chooses to continue increasing pitocin to max of 40 mu/min.  Will increase by 2 mu/min to achieve 40 mu/min, if no advancement in labor prior to that level. Per consult again with Dr. Estanislado Pandy, when advising her of patient's decision--if 40 mu/min is achieved, will hold there for 2 hours.  If no advancement in labor, or descent of vertex allowing safe AROM during that time, will recommend C/S. Consulted with pharmacy regarding risk of water intoxication with prolonged pitocin use--pitocin drip is already double concentrated, and no higher concentration is allowed by hospital protocol. Will continue to monitor I&O closely.   Nigel Bridgeman 09/27/2012, 10:45 PM

## 2012-09-28 ENCOUNTER — Encounter (HOSPITAL_COMMUNITY): Payer: Self-pay | Admitting: *Deleted

## 2012-09-28 ENCOUNTER — Encounter (HOSPITAL_COMMUNITY): Payer: Self-pay | Admitting: Anesthesiology

## 2012-09-28 ENCOUNTER — Encounter (HOSPITAL_COMMUNITY)
Admission: AD | Disposition: A | Discharge status: Home / Self Care | Payer: Self-pay | Source: Ambulatory Visit | Attending: Obstetrics and Gynecology

## 2012-09-28 ENCOUNTER — Inpatient Hospital Stay (HOSPITAL_COMMUNITY): Payer: Medicaid Other | Admitting: Anesthesiology

## 2012-09-28 LAB — CBC WITH DIFFERENTIAL/PLATELET
Basophils Relative: 0 % (ref 0–1)
Eosinophils Absolute: 0.1 10*3/uL (ref 0.0–0.7)
Eosinophils Relative: 1 % (ref 0–5)
Hemoglobin: 12.5 g/dL (ref 12.0–15.0)
Lymphs Abs: 1.7 10*3/uL (ref 0.7–4.0)
MCH: 28.3 pg (ref 26.0–34.0)
MCHC: 34.1 g/dL (ref 30.0–36.0)
MCV: 83 fL (ref 78.0–100.0)
Monocytes Relative: 8 % (ref 3–12)
RBC: 4.42 MIL/uL (ref 3.87–5.11)

## 2012-09-28 SURGERY — Surgical Case
Anesthesia: Spinal | Site: Abdomen | Wound class: Clean Contaminated

## 2012-09-28 MED ORDER — DIPHENHYDRAMINE HCL 25 MG PO CAPS: 25.0000 mg | ORAL_CAPSULE | Freq: Four times a day (QID) | ORAL | Status: DC | PRN

## 2012-09-28 MED ORDER — WITCH HAZEL-GLYCERIN EX PADS: 1.0000 "application " | MEDICATED_PAD | CUTANEOUS | Status: DC | PRN

## 2012-09-28 MED ORDER — MENTHOL 3 MG MT LOZG: 1.0000 | LOZENGE | OROMUCOSAL | Status: DC | PRN

## 2012-09-28 MED ORDER — KETOROLAC TROMETHAMINE 30 MG/ML IJ SOLN
30.0000 mg | Freq: Four times a day (QID) | INTRAMUSCULAR | Status: DC | PRN
  Administered 2012-09-28: 30 mg via INTRAVENOUS
  Filled 2012-09-28: qty 1

## 2012-09-28 MED ORDER — PHENYLEPHRINE 40 MCG/ML (10ML) SYRINGE FOR IV PUSH (FOR BLOOD PRESSURE SUPPORT)
PREFILLED_SYRINGE | INTRAVENOUS | Status: AC
  Filled 2012-09-28: qty 5

## 2012-09-28 MED ORDER — ONDANSETRON HCL 4 MG/2ML IJ SOLN
INTRAMUSCULAR | Status: AC
  Filled 2012-09-28: qty 2

## 2012-09-28 MED ORDER — NALOXONE HCL 1 MG/ML IJ SOLN
1.0000 ug/kg/h | INTRAVENOUS | Status: DC | PRN
  Filled 2012-09-28: qty 2

## 2012-09-28 MED ORDER — DEXTROSE 5 % IV SOLN
3.0000 g | Freq: Once | INTRAVENOUS | Status: AC
  Administered 2012-09-28: 3 g via INTRAVENOUS
  Filled 2012-09-28: qty 3000

## 2012-09-28 MED ORDER — KETOROLAC TROMETHAMINE 30 MG/ML IJ SOLN
INTRAMUSCULAR | Status: AC
  Administered 2012-09-28: 30 mg via INTRAVENOUS
  Filled 2012-09-28: qty 1

## 2012-09-28 MED ORDER — PHENYLEPHRINE 40 MCG/ML (10ML) SYRINGE FOR IV PUSH (FOR BLOOD PRESSURE SUPPORT)
PREFILLED_SYRINGE | INTRAVENOUS | Status: AC
  Filled 2012-09-28: qty 10

## 2012-09-28 MED ORDER — NALBUPHINE SYRINGE 5 MG/0.5 ML
5.0000 mg | INJECTION | INTRAMUSCULAR | Status: DC | PRN
  Filled 2012-09-28: qty 1

## 2012-09-28 MED ORDER — SODIUM CHLORIDE 0.9 % IJ SOLN: 3.0000 mL | INTRAMUSCULAR | Status: DC | PRN

## 2012-09-28 MED ORDER — FENTANYL CITRATE 0.05 MG/ML IJ SOLN
INTRAMUSCULAR | Status: AC
  Administered 2012-09-28: 50 ug via INTRAVENOUS
  Filled 2012-09-28: qty 2

## 2012-09-28 MED ORDER — SIMETHICONE 80 MG PO CHEW
80.0000 mg | CHEWABLE_TABLET | ORAL | Status: DC
  Administered 2012-09-30: 80 mg via ORAL

## 2012-09-28 MED ORDER — SIMETHICONE 80 MG PO CHEW
80.0000 mg | CHEWABLE_TABLET | ORAL | Status: DC | PRN
  Administered 2012-09-30: 80 mg via ORAL

## 2012-09-28 MED ORDER — DIPHENHYDRAMINE HCL 50 MG/ML IJ SOLN: 12.5000 mg | INTRAMUSCULAR | Status: DC | PRN

## 2012-09-28 MED ORDER — OXYTOCIN 40 UNITS IN LACTATED RINGERS INFUSION - SIMPLE MED: 62.5000 mL/h | INTRAVENOUS | Status: AC

## 2012-09-28 MED ORDER — ONDANSETRON HCL 4 MG/2ML IJ SOLN: 4.0000 mg | INTRAMUSCULAR | Status: DC | PRN

## 2012-09-28 MED ORDER — SCOPOLAMINE 1 MG/3DAYS TD PT72
1.0000 | MEDICATED_PATCH | Freq: Once | TRANSDERMAL | Status: DC
  Filled 2012-09-28: qty 1

## 2012-09-28 MED ORDER — FERROUS SULFATE 325 (65 FE) MG PO TABS
325.0000 mg | ORAL_TABLET | Freq: Two times a day (BID) | ORAL | Status: DC
  Administered 2012-09-29 – 2012-09-30 (×3): 325 mg via ORAL
  Filled 2012-09-28 (×4): qty 1

## 2012-09-28 MED ORDER — KETOROLAC TROMETHAMINE 30 MG/ML IJ SOLN: 30.0000 mg | Freq: Four times a day (QID) | INTRAMUSCULAR | Status: DC | PRN

## 2012-09-28 MED ORDER — IBUPROFEN 600 MG PO TABS: 600.0000 mg | ORAL_TABLET | Freq: Four times a day (QID) | ORAL | Status: DC

## 2012-09-28 MED ORDER — DIPHENHYDRAMINE HCL 50 MG/ML IJ SOLN: 25.0000 mg | INTRAMUSCULAR | Status: DC | PRN

## 2012-09-28 MED ORDER — IBUPROFEN 600 MG PO TABS
600.0000 mg | ORAL_TABLET | Freq: Four times a day (QID) | ORAL | Status: DC
  Administered 2012-09-29 – 2012-10-01 (×10): 600 mg via ORAL
  Filled 2012-09-28 (×11): qty 1

## 2012-09-28 MED ORDER — PHENYLEPHRINE HCL 10 MG/ML IJ SOLN
INTRAMUSCULAR | Status: DC | PRN
  Administered 2012-09-28 (×4): 40 ug via INTRAVENOUS
  Administered 2012-09-28: 80 ug via INTRAVENOUS
  Administered 2012-09-28 (×6): 40 ug via INTRAVENOUS

## 2012-09-28 MED ORDER — KETOROLAC TROMETHAMINE 30 MG/ML IJ SOLN
30.0000 mg | Freq: Four times a day (QID) | INTRAMUSCULAR | Status: DC | PRN
  Administered 2012-09-28: 30 mg via INTRAVENOUS

## 2012-09-28 MED ORDER — FENTANYL CITRATE 0.05 MG/ML IJ SOLN
25.0000 ug | INTRAMUSCULAR | Status: DC | PRN
  Administered 2012-09-28: 50 ug via INTRAVENOUS

## 2012-09-28 MED ORDER — SENNOSIDES-DOCUSATE SODIUM 8.6-50 MG PO TABS
2.0000 | ORAL_TABLET | ORAL | Status: DC
  Administered 2012-09-29 – 2012-09-30 (×3): 2 via ORAL

## 2012-09-28 MED ORDER — PRENATAL MULTIVITAMIN CH
1.0000 | ORAL_TABLET | Freq: Every day | ORAL | Status: DC
  Administered 2012-09-29 – 2012-10-01 (×3): 1 via ORAL
  Filled 2012-09-28 (×3): qty 1

## 2012-09-28 MED ORDER — ZOLPIDEM TARTRATE 5 MG PO TABS: 5.0000 mg | ORAL_TABLET | Freq: Every evening | ORAL | Status: DC | PRN

## 2012-09-28 MED ORDER — LORATADINE 10 MG PO TABS
10.0000 mg | ORAL_TABLET | Freq: Every day | ORAL | Status: DC
  Administered 2012-09-29 – 2012-10-01 (×3): 10 mg via ORAL
  Filled 2012-09-28 (×5): qty 1

## 2012-09-28 MED ORDER — BUPIVACAINE IN DEXTROSE 0.75-8.25 % IT SOLN
INTRATHECAL | Status: DC | PRN
  Administered 2012-09-28: 1.2 mL via INTRATHECAL

## 2012-09-28 MED ORDER — MORPHINE SULFATE (PF) 0.5 MG/ML IJ SOLN
INTRAMUSCULAR | Status: DC | PRN
  Administered 2012-09-28: .1 mg via INTRATHECAL

## 2012-09-28 MED ORDER — 0.9 % SODIUM CHLORIDE (POUR BTL) OPTIME
TOPICAL | Status: DC | PRN
  Administered 2012-09-28: 1000 mL

## 2012-09-28 MED ORDER — EPHEDRINE 5 MG/ML INJ
INTRAVENOUS | Status: AC
  Filled 2012-09-28: qty 10

## 2012-09-28 MED ORDER — ONDANSETRON HCL 4 MG/2ML IJ SOLN
INTRAMUSCULAR | Status: DC | PRN
  Administered 2012-09-28: 4 mg via INTRAVENOUS

## 2012-09-28 MED ORDER — LANOLIN HYDROUS EX OINT: 1.0000 "application " | TOPICAL_OINTMENT | CUTANEOUS | Status: DC | PRN

## 2012-09-28 MED ORDER — OXYCODONE-ACETAMINOPHEN 5-325 MG PO TABS
1.0000 | ORAL_TABLET | ORAL | Status: DC | PRN
  Administered 2012-09-28 – 2012-09-29 (×2): 1 via ORAL
  Administered 2012-09-29 (×3): 2 via ORAL
  Administered 2012-09-30: 1 via ORAL
  Administered 2012-09-30 – 2012-10-01 (×5): 2 via ORAL
  Filled 2012-09-28 (×3): qty 2
  Filled 2012-09-28: qty 1
  Filled 2012-09-28: qty 2
  Filled 2012-09-28: qty 1
  Filled 2012-09-28 (×2): qty 2
  Filled 2012-09-28 (×3): qty 1
  Filled 2012-09-28: qty 2

## 2012-09-28 MED ORDER — MEASLES, MUMPS & RUBELLA VAC ~~LOC~~ INJ
0.5000 mL | INJECTION | Freq: Once | SUBCUTANEOUS | Status: DC
  Filled 2012-09-28: qty 0.5

## 2012-09-28 MED ORDER — NALOXONE HCL 0.4 MG/ML IJ SOLN: 0.4000 mg | INTRAMUSCULAR | Status: DC | PRN

## 2012-09-28 MED ORDER — METOCLOPRAMIDE HCL 5 MG/ML IJ SOLN: 10.0000 mg | Freq: Three times a day (TID) | INTRAMUSCULAR | Status: DC | PRN

## 2012-09-28 MED ORDER — SIMETHICONE 80 MG PO CHEW
80.0000 mg | CHEWABLE_TABLET | Freq: Three times a day (TID) | ORAL | Status: DC
  Administered 2012-09-29 – 2012-10-01 (×6): 80 mg via ORAL

## 2012-09-28 MED ORDER — DIBUCAINE 1 % RE OINT: 1.0000 "application " | TOPICAL_OINTMENT | RECTAL | Status: DC | PRN

## 2012-09-28 MED ORDER — TETANUS-DIPHTH-ACELL PERTUSSIS 5-2.5-18.5 LF-MCG/0.5 IM SUSP
0.5000 mL | Freq: Once | INTRAMUSCULAR | Status: AC
  Administered 2012-10-01: 0.5 mL via INTRAMUSCULAR
  Filled 2012-09-28: qty 0.5

## 2012-09-28 MED ORDER — MORPHINE SULFATE 0.5 MG/ML IJ SOLN
INTRAMUSCULAR | Status: AC
  Filled 2012-09-28: qty 10

## 2012-09-28 MED ORDER — KETOROLAC TROMETHAMINE 60 MG/2ML IM SOLN
60.0000 mg | Freq: Once | INTRAMUSCULAR | Status: AC | PRN
  Filled 2012-09-28: qty 2

## 2012-09-28 MED ORDER — ONDANSETRON HCL 4 MG/2ML IJ SOLN: 4.0000 mg | Freq: Three times a day (TID) | INTRAMUSCULAR | Status: DC | PRN

## 2012-09-28 MED ORDER — ONDANSETRON HCL 4 MG PO TABS: 4.0000 mg | ORAL_TABLET | ORAL | Status: DC | PRN

## 2012-09-28 MED ORDER — FENTANYL CITRATE 0.05 MG/ML IJ SOLN
INTRAMUSCULAR | Status: DC | PRN
  Administered 2012-09-28: 15 ug via INTRATHECAL

## 2012-09-28 MED ORDER — LACTATED RINGERS IV SOLN
INTRAVENOUS | Status: DC
  Administered 2012-09-28: 20:00:00 via INTRAVENOUS

## 2012-09-28 MED ORDER — OXYTOCIN 10 UNIT/ML IJ SOLN
40.0000 [IU] | INTRAVENOUS | Status: DC | PRN
  Administered 2012-09-28: 40 [IU] via INTRAVENOUS

## 2012-09-28 MED ORDER — OXYTOCIN 10 UNIT/ML IJ SOLN
INTRAMUSCULAR | Status: AC
  Filled 2012-09-28: qty 4

## 2012-09-28 MED ORDER — DIPHENHYDRAMINE HCL 25 MG PO CAPS: 25.0000 mg | ORAL_CAPSULE | ORAL | Status: DC | PRN

## 2012-09-28 MED ORDER — MEPERIDINE HCL 25 MG/ML IJ SOLN: 6.2500 mg | INTRAMUSCULAR | Status: DC | PRN

## 2012-09-28 MED ORDER — FENTANYL CITRATE 0.05 MG/ML IJ SOLN
INTRAMUSCULAR | Status: AC
  Filled 2012-09-28: qty 2

## 2012-09-28 MED ORDER — EPHEDRINE SULFATE 50 MG/ML IJ SOLN
INTRAMUSCULAR | Status: DC | PRN
  Administered 2012-09-28: 5 mg via INTRAVENOUS

## 2012-09-28 MED ORDER — AMLODIPINE BESYLATE 5 MG PO TABS
5.0000 mg | ORAL_TABLET | Freq: Every day | ORAL | Status: DC
  Administered 2012-09-29: 5 mg via ORAL
  Filled 2012-09-28 (×2): qty 1

## 2012-09-28 SURGICAL SUPPLY — 37 items
BARRIER ADHS 3X4 INTERCEED (GAUZE/BANDAGES/DRESSINGS) ×2 IMPLANT
BENZOIN TINCTURE PRP APPL 2/3 (GAUZE/BANDAGES/DRESSINGS) IMPLANT
CLAMP CORD UMBIL (MISCELLANEOUS) IMPLANT
CLEANER TIP ELECTROSURG 2X2 (MISCELLANEOUS) IMPLANT
CLOTH BEACON ORANGE TIMEOUT ST (SAFETY) ×2 IMPLANT
DERMABOND ADVANCED (GAUZE/BANDAGES/DRESSINGS)
DERMABOND ADVANCED .7 DNX12 (GAUZE/BANDAGES/DRESSINGS) IMPLANT
DEVICE BLD TRNS LUER ATTCH (MISCELLANEOUS) ×2 IMPLANT
DRAPE LG THREE QUARTER DISP (DRAPES) ×4 IMPLANT
DRSG OPSITE POSTOP 4X10 (GAUZE/BANDAGES/DRESSINGS) ×2 IMPLANT
DURAPREP 26ML APPLICATOR (WOUND CARE) ×2 IMPLANT
ELECT REM PT RETURN 9FT ADLT (ELECTROSURGICAL) ×2
ELECTRODE REM PT RTRN 9FT ADLT (ELECTROSURGICAL) ×1 IMPLANT
EXTRACTOR VACUUM BELL STYLE (SUCTIONS) IMPLANT
GLOVE BIO SURGEON STRL SZ7 (GLOVE) ×2 IMPLANT
GLOVE BIOGEL PI IND STRL 7.0 (GLOVE) ×3 IMPLANT
GLOVE BIOGEL PI INDICATOR 7.0 (GLOVE) ×3
GOWN PREVENTION PLUS XLARGE (GOWN DISPOSABLE) ×4 IMPLANT
GOWN STRL REIN XL XLG (GOWN DISPOSABLE) IMPLANT
KIT ABG SYR 3ML LUER SLIP (SYRINGE) IMPLANT
NEEDLE HYPO 25X5/8 SAFETYGLIDE (NEEDLE) IMPLANT
NS IRRIG 1000ML POUR BTL (IV SOLUTION) ×2 IMPLANT
PACK C SECTION WH (CUSTOM PROCEDURE TRAY) ×2 IMPLANT
PAD OB MATERNITY 4.3X12.25 (PERSONAL CARE ITEMS) ×2 IMPLANT
PENCIL BUTTON HOLSTER BLD 10FT (ELECTRODE) IMPLANT
RTRCTR C-SECT PINK 25CM LRG (MISCELLANEOUS) ×2 IMPLANT
STRIP CLOSURE SKIN 1/2X4 (GAUZE/BANDAGES/DRESSINGS) IMPLANT
SUT CHROMIC 0 CTX 36 (SUTURE) ×6 IMPLANT
SUT PLAIN 2 0 (SUTURE)
SUT PLAIN 2 0 XLH (SUTURE) ×2 IMPLANT
SUT PLAIN ABS 2-0 54XMFL TIE (SUTURE) IMPLANT
SUT VIC AB 0 CT1 27 (SUTURE) ×2
SUT VIC AB 0 CT1 27XBRD ANBCTR (SUTURE) ×2 IMPLANT
SUT VIC AB 4-0 KS 27 (SUTURE) ×2 IMPLANT
TOWEL OR 17X24 6PK STRL BLUE (TOWEL DISPOSABLE) ×2 IMPLANT
TRAY FOLEY CATH 14FR (SET/KITS/TRAYS/PACK) ×2 IMPLANT
WATER STERILE IRR 1000ML POUR (IV SOLUTION) ×2 IMPLANT

## 2012-09-28 NOTE — Progress Notes (Signed)
Received check out from Dr. Estanislado Pandy.  Pt history and hospital course reviewed.  Planned for primary cesarean section due to failed induction. Introduced myself to patient.  Reviewed risks with pt, including but not limited to infection, including wound breakdown requiring packing of incision. All questions answered.  Consent signed.

## 2012-09-28 NOTE — Anesthesia Preprocedure Evaluation (Addendum)
Anesthesia Evaluation  Patient identified by MRN, date of birth, ID band Patient awake    Reviewed: Allergy & Precautions, H&P , NPO status , Patient's Chart, lab work & pertinent test results, reviewed documented beta blocker date and time   History of Anesthesia Complications Negative for: history of anesthetic complications  Airway Mallampati: II TM Distance: >3 FB Neck ROM: full    Dental  (+) Teeth Intact   Pulmonary neg pulmonary ROS,  breath sounds clear to auscultation  Pulmonary exam normal       Cardiovascular hypertension (CHTN), Rhythm:regular Rate:Normal     Neuro/Psych  Headaches (migraines), negative psych ROS   GI/Hepatic negative GI ROS, Neg liver ROS,   Endo/Other  Morbid obesity  Renal/GU negative Renal ROS  negative genitourinary   Musculoskeletal   Abdominal Normal abdominal exam  (+)   Peds  Hematology negative hematology ROS (+)   Anesthesia Other Findings   Reproductive/Obstetrics (+) Pregnancy (failed IOL)                          Anesthesia Physical Anesthesia Plan  ASA: III and emergent  Anesthesia Plan: Spinal   Post-op Pain Management:    Induction:   Airway Management Planned:   Additional Equipment:   Intra-op Plan:   Post-operative Plan:   Informed Consent: I have reviewed the patients History and Physical, chart, labs and discussed the procedure including the risks, benefits and alternatives for the proposed anesthesia with the patient or authorized representative who has indicated his/her understanding and acceptance.     Plan Discussed with: Surgeon and CRNA  Anesthesia Plan Comments:         Anesthesia Quick Evaluation

## 2012-09-28 NOTE — Transfer of Care (Signed)
Immediate Anesthesia Transfer of Care Note  Patient: Karen Ashley  Procedure(s) Performed: Procedure(s): Primary cesarean section with delivery of baby girl at 0907.  Apgars 9/10. (N/A)  Patient Location: PACU  Anesthesia Type:Spinal  Level of Consciousness: awake and alert   Airway & Oxygen Therapy: Patient Spontanous Breathing  Post-op Assessment: Report given to PACU RN and Post -op Vital signs reviewed and stable  Post vital signs: Reviewed and stable  Complications: No apparent anesthesia complications

## 2012-09-28 NOTE — Brief Op Note (Signed)
09/25/2012 - 09/28/2012  11:16 AM  PATIENT:  Karen Ashley  27 y.o. female  PRE-OPERATIVE DIAGNOSIS:  Pregnancy at 40 3/7 weeks, failed induction, morbid obesity  POST-OPERATIVE DIAGNOSIS:  Same  PROCEDURE:  Procedure(s): Primary cesarean section with delivery of baby girl at 0907.  Apgars 9/10. (N/A)  SURGEON:  Surgeon(s) and Role:    * Geryl Rankins, MD - Primary  PHYSICIAN ASSISTANT: None  ASSISTANTS: Elsie Ra, CNM, Technician  ANESTHESIA:   Spinal  EBL:  Total I/O In: 2775 [I.V.:2775] Out: 1850 [Urine:850; Blood:1000]  BLOOD ADMINISTERED:none  DRAINS: Urinary Catheter (Foley)   LOCAL MEDICATIONS USED:  NONE  SPECIMEN:  No Specimen  DISPOSITION OF SPECIMEN:  N/A  COUNTS:  YES  TOURNIQUET:  * No tourniquets in log *  DICTATION: .Other Dictation: Dictation Number [id number]  PLAN OF CARE: Transfer to Mother-Baby.  PATIENT DISPOSITION:  PACU - hemodynamically stable.   Delay start of Pharmacological VTE agent (>24hrs) due to surgical blood loss or risk of bleeding: not applicable

## 2012-09-28 NOTE — Consult Note (Signed)
Neonatology Note:  Attendance at C-section:  I was asked by Dr. Varnado to attend this primary C/S at term after failed induction. The mother is a G1P0 O pos, GBS neg with chronic HTN. ROM at delivery, fluid with thin meconium. Infant vigorous with good spontaneous cry and tone. Needed only minimal bulb suctioning. Ap 9/10. Lungs clear to ausc in DR. To CN to care of Pediatrician.  Theodoros Stjames C. Lenzi Marmo, MD  

## 2012-09-28 NOTE — Progress Notes (Addendum)
  Subjective: Aware of contractions, but able to sleep at intervals.  Objective: BP 151/89  Pulse 73  Temp(Src) 98.6 F (37 C) (Oral)  Resp 18  Ht 5\' 2"  (1.575 m)  Wt 280 lb (127.007 kg)  BMI 51.2 kg/m2  LMP 12/20/2011   Filed Vitals:   09/28/12 0406 09/28/12 0431 09/28/12 0501 09/28/12 0531  BP: 136/76 148/85 151/89 139/89  Pulse: 73 79 73 70  Temp:      TempSrc:      Resp: 18 18 18    Height:      Weight:           FHT:  Category 1 UC:   irregular, every 2-5 minutes, mild SVE:   Dilation: 4 Effacement (%): 60 Station: -3 Exam by:: Nigel Bridgeman, CNM No change in cervix, and vtx still too high for AROM.  Assessment / Plan: Failed induction--chronic hypertension Reviewed findings with patient and partner--I recommend C/S due to failure of all methods utilized (Cytotech, foley x 2, pitocin x 2 days to maximum dose) to accomplish active labor. R&B of C/S reviewed with patient, including bleeding, infection, damage to other organs, and risks of anesthesia.  Patient and partner seem to understand these risks and are in agreement to proceed. She has been NPO since MN. Dr. Bradly Chris will coordinate with OR.  Nigel Bridgeman 09/28/2012, 6:02 AM  Addendum: Per consult with OR, C/S is posted for 8am (patient should be ready by 7:30am)--Dr. Dion Body will do case. Will need blood available due to BMI. Ancef 3 gm IV ordered.  Nigel Bridgeman, CNM 09/28/12 6:30am

## 2012-09-28 NOTE — Anesthesia Postprocedure Evaluation (Signed)
  Anesthesia Post-op Note  Patient: Karen Ashley  Procedure(s) Performed: Procedure(s): Primary cesarean section with delivery of baby girl at 0907.  Apgars 9/10. (N/A)  Patient Location: 129  Anesthesia Type:Spinal  Level of Consciousness: awake  Airway and Oxygen Therapy: Patient Spontanous Breathing  Post-op Pain: mild  Post-op Assessment: Patient's Cardiovascular Status Stable and Respiratory Function Stable  Post-op Vital Signs: stable  Complications: No apparent anesthesia complications

## 2012-09-28 NOTE — OR Nursing (Addendum)
Uterus massaged by S. Trinna Kunst RN.  100 cc of blood evacuated from uterus during uterine massage. Two tubes of cord blood sent to lab. 

## 2012-09-28 NOTE — Progress Notes (Signed)
Pt transferred with house coverage (carol)

## 2012-09-28 NOTE — Anesthesia Postprocedure Evaluation (Signed)
Anesthesia Post Note  Patient: Karen Ashley  Procedure(s) Performed: Procedure(s) (LRB): Primary cesarean section with delivery of baby girl at [phone].  Apgars 9/10. (N/A)  Anesthesia type: Epidural  Patient location: PACU  Post pain: Pain level controlled  Post assessment: Post-op Vital signs reviewed  Last Vitals:  Filed Vitals:   09/28/12 1100  BP: 132/69  Pulse: 76  Temp: 36.9 C  Resp: 20    Post vital signs: Reviewed  Level of consciousness: awake  Complications: No apparent anesthesia complications

## 2012-09-28 NOTE — OR Nursing (Signed)
Foley catheter in upon arrival to OR. Urine color-yellow. 

## 2012-09-28 NOTE — Progress Notes (Signed)
  Subjective: Sleeping soundly.  FOB at bedside, also sleeping.  Objective: BP 149/91  Pulse 77  Temp(Src) 98.6 F (37 C) (Oral)  Resp 16  Ht 5\' 2"  (1.575 m)  Wt 280 lb (127.007 kg)  BMI 51.2 kg/m2  LMP 12/20/2011      FHT: Category 1 UC:   irregular, every 2-6 minutes Pitocin on 30 mu/min  Assessment / Plan: Serial induction--now on day 3. Chronic hypertension Will continue increasing pitocin to max of 40 mu/min--then will hold there for 2 hours.   Nigel Bridgeman 09/28/2012, 2:34 AM

## 2012-09-28 NOTE — Anesthesia Procedure Notes (Signed)
Spinal  Patient location during procedure: OR Start time: 09/28/2012 8:39 AM Staffing Anesthesiologist: CASSIDY, AMY Performed by: anesthesiologist  Preanesthetic Checklist Completed: patient identified, site marked, surgical consent, pre-op evaluation, timeout performed, IV checked, risks and benefits discussed and monitors and equipment checked Spinal Block Patient position: sitting Prep: site prepped and draped and DuraPrep Patient monitoring: blood pressure, continuous pulse ox and heart rate Approach: midline Location: L3-4 Injection technique: single-shot Needle Needle type: Pencan  Needle gauge: 24 G Needle length: 9 cm Assessment Sensory level: T4 Additional Notes Clear free flow CSF on first attempt. No paresthesia.  Patient tolerated procedure well with no apparent complications.  Jasmine December, MD

## 2012-09-28 NOTE — Progress Notes (Signed)
  Subjective: Awake, aware of some contractions, but not in significant pain.  Objective: BP 148/85  Pulse 79  Temp(Src) 98.6 F (37 C) (Oral)  Resp 18  Ht 5\' 2"  (1.575 m)  Wt 280 lb (127.007 kg)  BMI 51.2 kg/m2  LMP 12/20/2011      Filed Vitals:   09/28/12 0301 09/28/12 0331 09/28/12 0406 09/28/12 0431  BP: 151/92 154/82 136/76 148/85  Pulse: 76 79 73 79  Temp:      TempSrc:      Resp:  18    Height:      Weight:         FHT: Category 1 UC:   irregular, every 3-5 minutes Pitocin on 40 mu/min since 4:05am  Assessment / Plan: Prolonged induction--chronic hypertension Will maintain pitocin at 40 mu/min until 6am, then recheck cervix. Will AROM if vtx has descended.  If no change in cervix and AROM is not an option, will recommend C/S.  Karen Ashley 09/28/2012, 5:06 AM

## 2012-09-29 LAB — CBC
HCT: 29.6 % — ABNORMAL LOW (ref 36.0–46.0)
Hemoglobin: 10.1 g/dL — ABNORMAL LOW (ref 12.0–15.0)
MCV: 83.4 fL (ref 78.0–100.0)
RBC: 3.55 MIL/uL — ABNORMAL LOW (ref 3.87–5.11)
WBC: 11.9 10*3/uL — ABNORMAL HIGH (ref 4.0–10.5)

## 2012-09-29 LAB — TYPE AND SCREEN
Antibody Screen: NEGATIVE
Unit division: 0

## 2012-09-29 MED ORDER — POLYSACCHARIDE IRON COMPLEX 150 MG PO CAPS
150.0000 mg | ORAL_CAPSULE | Freq: Every day | ORAL | Status: DC
  Administered 2012-09-29 – 2012-10-01 (×3): 150 mg via ORAL
  Filled 2012-09-29 (×4): qty 1

## 2012-09-29 NOTE — Progress Notes (Signed)
Clinical Social Work Department PSYCHOSOCIAL ASSESSMENT - MATERNAL/CHILD 09/29/2012  Patient:  MARGARETT, VITI  Account Number:  [id number]  Admit Date:  09/25/2012  Marjo Bicker Name:   Azzie Roup    Clinical Social Worker:  Lila Lufkin, LCSW   Date/Time:  09/29/2012 10:00 AM  Date Referred:  09/28/2012   Referral source  CSW     Referred reason  Substance Abuse   Other referral source:    I:  FAMILY / HOME ENVIRONMENT Child's legal guardian:  PARENT  Guardian - Name Guardian - Age Guardian - Address  Ledesma,Kalysta [address]  [place], [place] [address]  Cleda Clarks [address]  [place], [place] [address]   Other household support members/support persons Other support:    II  PSYCHOSOCIAL DATA Information Source:  Patient Interview  Event organiser Employment:   Surveyor, quantity resources:  [employer] If [employer]:   Other  [employer]  Chemical engineer / Grade:   Maternity Care Coordinator / Child Services Coordination / Early Interventions:  Cultural issues impacting care:   none related or noted    III  STRENGTHS  Strength comment:    IV  RISK FACTORS AND CURRENT PROBLEMS Current Problem:       V  SOCIAL WORK ASSESSMENT Met with mother who was pleasant and receptive to social work intervention.  She and newborn's father cohabitate.  She have other dependents.  Both parents are employed.   Mother reports hx of occasional marijuana and ETOH use.   Informed that she did not use any alcohol or illicit drugs during pregnancy.  She reports last use of marijuana 10/2011.  UDS on baby was negative.  She denies any hx of mental illness.  Mother reports extensive family support.  Father plans to take time off from work and family members and friends have been very supportive.  She reports being well prepared for newborn's home coming.  No acute social concerns noted or reported at this time.  Mother informed of social work Surveyor, mining.   CSW will follow  PRN.  VI SOCIAL WORK PLAN  Type of pt/family education:   If child protective services report - county:   If child protective services report - date:   Information/referral to community resources comment:   Other social work plan:   CSW will continue to follow PRN.   Miriam Kestler J, LCSW

## 2012-09-29 NOTE — Progress Notes (Signed)
Patient IVF's completed; IV NSL. Encouraged patient to drink plenty of fluids. Informed patient that urinary catheter would be removed during the 0500 hour and she would have 6 hours to void before being in/out catheterized. Patient voiced understanding.   Forrest Moron, RN

## 2012-09-29 NOTE — Lactation Note (Signed)
This note was copied from the chart of Girl Kamry Overstreet. Lactation Consultation Note  Patient Name: Girl Karl Fleig Today's Date: 09/29/2012 Reason for consult: Follow-up assessment Mother has been latching baby on the left breast but has not achieved latch on the right. Called to assist. Breast massage and hand expression yielded and good volume of thick colostrum that was easily expressed. Taught mother how to hand express. Baby latched well and fed actively, needed some stimulation to avoid sleeping. Mother taught alternate breast massage. Breast pumping in addition to latching was encouraged due to blood loss after delivery. In some occasions, may delay milk coming to volume. Mother is motivated to breast feed, showing independence with latching, and baby making progression with feedings. Mother to pump about 30 minutes following feedings for 15 minutes. May omit tonight for rest. Discussed potential for baby to cluster feed and that will also cause increase stimulation that aids towards production. LC to follow tomorrow. Mom to call RN if needs assistance with latching tonight.  Maternal Data    Feeding Feeding Type: Breast Milk Length of feed: 10 min  LATCH Score/Interventions Latch: Grasps breast easily, tongue down, lips flanged, rhythmical sucking. Intervention(s): Assist with latch;Adjust position  Audible Swallowing: Spontaneous and intermittent Intervention(s): Hand expression;Skin to skin (to initiate milk flow)  Type of Nipple: Everted at rest and after stimulation  Comfort (Breast/Nipple): Soft / non-tender (filling)     Hold (Positioning): Assistance needed to correctly position infant at breast and maintain latch. (wanted assistance to latch on the right breast) Intervention(s): Breastfeeding basics reviewed;Support Pillows;Skin to skin  LATCH Score: 9  Lactation Tools Discussed/Used Pump Review: Setup, frequency, and cleaning Initiated by:: Barrie Dunker, RN Date  initiated:: 09/29/12   Consult Status Consult Status: Follow-up Date: 09/30/12 Follow-up type: In-patient    Christella Hartigan M 09/29/2012, 5:58 PM

## 2012-09-29 NOTE — Op Note (Signed)
[name], Karen Ashley NO.:  [id number]  MEDICAL RECORD NO.:  [id number]  LOCATION:  9129                          FACILITY:  [facility]  PHYSICIAN:  Pieter Partridge, MD   DATE OF BIRTH:  11/29/1985  DATE OF PROCEDURE:  09/28/2012 DATE OF DISCHARGE:                              OPERATIVE REPORT   PREOPERATIVE DIAGNOSIS:  Pregnancy at 58 and 3/7th weeks, failed induction, morbid obesity.  POSTOPERATIVE DIAGNOSIS:  Pregnancy at 40 and 3/7th weeks, failed induction, morbid obesity.  PROCEDURE:  Primary low-transverse cesarean section with 2-layer closure.  SURGEON:  Dr. Pieter Partridge.  ASSISTANT:  Rhona Leavens, CNM and technician.  ANESTHESIA:  Spinal.  IV FLUIDS:  In, 2775; urine output is 850.  ESTIMATED BLOOD LOSS:  1000 mL.  BLOOD ADMINISTERED:  None.  DRAINS:  Foley catheter.  LOCAL:  None.  SPECIMEN:  None.  COMPLICATIONS:  None.  PLAN OF CARE:  Transferred to Mother 55.  DISPOSITION:  PACU, hemodynamically stable.  FINDINGS:  Viable vertex infant, was not in the pelvis.  Apgars 9 and 10.  Weight is pending.  No nuchal cord noted.  Normal uterus, ovaries, and fallopian tubes bilaterally.  Large pannus but adipose above fascia is not excessive.  Moderate meconium.  PROCEDURE IN DETAIL:  Karen Karen Ashley is a 27 year old, gravida 1, para 0, at 51 and 3/7th weeks, who was on day 4 of an attempt to induce labor.  She had failed Cytotec, Foley bulb induction, Pitocin, and Dr. Estanislado Pandy counseled patient with managing labor 12 hours prior to the C-section, and it was determined that she needed a primary low-transverse cesarean section.  I came on, introduced myself to the patient, and risk and benefits were reviewed, and consents were obtained.  Patient was taken to the operating room.  She underwent spinal anesthesia without complication.  She was then prepped and draped in the normal sterile fashion and placed in the dorsal supine position with  a leftward tilt.  She was prepped and draped in normal sterile fashion.  After adequate anesthesia confirmed with Allis clamp, the abdomen was marked.  A Pfannenstiel skin incision was made with a scalpel on the skin.  She had some large subcutaneous vessels that were clamped with a hemostat and cauterized with a Bovie, going down to the fascia.  The patient was not as deep as expected given her habitus.  The fascia was incised with a Bovie and extended laterally with a curved Mayo scissors. There was some bleeding from the muscle that was cauterized with the Bovie.  The fascia was then grasped with Kocher clamps and the rectus muscles were dissected off of the fascia sharply inferiorly and superiorly.  The peritoneum was then identified.  The muscles were separated and peritoneum was identified, entered bluntly, and stretched. Uterus was palpated externally and the fascia was then palpated to make sure there were no adhesions.  The Alexis retractor was then inserted and then applied as normal.  The Metzenbaum scissors were used to incise the serosa to develop a bladder flap.  The transverse incision was made with a scalpel in the lower uterine segment.  It was very thin, so the uterus was grasped with Allis clamps.  Until intrauterine access was confirmed, moderate meconium projected out of the uterus.  There was no fetal injury at all. The incision was then extended bluntly.  The occiput was brought to the incision, and the baby was delivered head first.  Nose and mouth were suctioned and then followed by shoulders.  Baby was placed in Trendelenburg, and nose and mouth were adequately suctioned to reduce meconium aspiration.  Cord was clamped x2 and cut.  Baby was placed at the bedside to the awaiting NICU team.  The placenta was removed and avulsion of the cord was made when applying traction on the cord to remove it.  The placenta was then manually removed.  Atony was noted, and  the uterus was massaged until firm. Pitocin was doubled, and the tone returned to the uterus.  The hysterotomy incision was closed with 0 chromic in a running locked fashion for the first layer, and then the second layer of the same suture was used for an imbrication stitch, and 2 other 0 chromic were used for hemostasis.  There was a piece of the omentum into the abdomen and moistened laparotomy sponge with a hemostat on the end was placed in the abdomen during the hysterotomy closure and was removed afterwards.  The gutters were copiously irrigated, and then the hysterotomy incision was dried.  Interceed was then applied to the lower uterine segment. The bladder was then inspected.  There were no injuries.  The urine was clear per Anesthesia.  The fascia was then examined for any bleeding, and there was some, and it was cauterized with the Bovie.  The muscles were examined and cauterized as needed.  The muscles and peritoneum were then closed and reapproximated in 3 interrupted sutures of 0 chromic, bringing the peritoneum back and getting some of the omentum out of the way for the fascia closure.  The fascia was then reapproximated with 0 Vicryl in a continuous running fashion and a subcutaneous space was reapproximated with 2-0 plain gut in a continuous running fashion, and the skin was then reapproximated with 4-0 Vicryl on a Keith needle. Steri-Strips and benzoin were to be applied and a pressure dressing to follow.  All instrument, sponge and needle counts were correct x3.  Patient was taken to the recovery room in stable condition.  The baby remained in the room and did skin-to-skin with dad during the completion of the procedure.  All instruments, sponge, and needle counts were correct. Patient had SCDs on and received her preop antibiotics.     Pieter Partridge, MD     EBV/MEDQ  D:  09/28/2012  T:  09/29/2012  Job:  [id number]

## 2012-09-29 NOTE — Progress Notes (Signed)
Subjective: Postpartum Day 1: Cesarean Delivery secondary to: failed induction  Patient reports incisional pain, tolerating PO, + flatus and no problems voiding.   no complaints, up ad lib without syncope Pain well controlled with po meds BF initiated, having more difficulty on L side Mood stable, bonding well Contraception: undecided   Objective: Vital signs in last 24 hours: Temp:  [97.5 F (36.4 C)-100.4 F (38 C)] 98.4 F (36.9 C) (09/14 0500) Pulse Rate:  [74-116] 94 (09/14 0500) Resp:  [18-24] 20 (09/14 0500) BP: (109-124)/(66-84) 122/84 mmHg (09/14 0907) SpO2:  [97 %-100 %] 100 % (09/14 0500)  Filed Vitals:   09/29/12 0100 09/29/12 0226 09/29/12 0500 09/29/12 0907  BP: 124/69  116/76 122/84  Pulse: 116  94   Temp: 100.2 F (37.9 C) 99.4 F (37.4 C) 98.4 F (36.9 C)   TempSrc: Oral Oral Oral   Resp: 24  20   Height:      Weight:      SpO2: 100%  100%      Physical Exam:  General: alert and no distress Heart: RRR Lungs: CTAB Abdomen: BS x4 Uterine Fundus: firm Incision: pressure dressing in place, CDI    Lochia: appropriate DVT Evaluation: No evidence of DVT seen on physical exam. Negative Homan's sign. Calf/Ankle edema is present. Mild 1+    Recent Labs  09/28/12 0650 09/29/12 0640  HGB 12.5 10.1*  HCT 36.7 29.6*    Assessment/Plan: Status post Cesarean section. Doing well postoperatively.  Mild anemia, will order FE supp Enc up to shower and remove pressure dressing D/w Dr Dion Body, will d/c norvasc (took prior to preg) and have pt RTO in 1 week to evaluate pressure Currently normotensive  Plan d/c Tuesday, possible tmrw.    Ragan Duhon M 09/29/2012, 12:02 PM

## 2012-09-29 NOTE — Progress Notes (Signed)
Reviewed with CNM note and agree  Continued Norvasc postop but informed by CNM, pt only needed Norvasc on day of admission but was o/w normotensive throughout pregnancy. Discontinue now and observe for the remainder of hospital stay.  Resume PRN.  Pt seen and is doing well. AFVSS. Encouraged ambulation in halls TID.

## 2012-09-30 ENCOUNTER — Encounter (HOSPITAL_COMMUNITY): Payer: Self-pay | Admitting: Obstetrics and Gynecology

## 2012-09-30 NOTE — Progress Notes (Signed)
Ur chart review completed.  

## 2012-09-30 NOTE — Progress Notes (Signed)
Subjective:  Postpartum Day 2: Cesarean Delivery Patient reports incisional pain.    Objective: Vital signs in last 24 hours: Temp:  [98.2 F (36.8 C)-98.6 F (37 C)] 98.6 F (37 C) (09/15 0517) Pulse Rate:  [81-106] 106 (09/15 0517) Resp:  [18-20] 18 (09/15 0517) BP: (128-130)/(69-83) 130/69 mmHg (09/15 0517)  Physical Exam:  General: no distress Lochia: appropriate Uterine Fundus: firm Incision: healing well DVT Evaluation: No evidence of DVT seen on physical exam.   Recent Labs  09/28/12 0650 09/29/12 0640  HGB 12.5 10.1*  HCT 36.7 29.6*    Assessment/Plan: Status post Cesarean section. Doing well postoperatively.  Continue current care. Plan discharge tomorrow.  Dalana Pfahler V 09/30/2012, 4:59 PM

## 2012-09-30 NOTE — Lactation Note (Addendum)
This note was copied from the chart of Karen Tambra Buske. Lactation Consultation Note  Patient Name: Karen Ashley Today's Date: 09/30/2012 Reason for consult: Follow-up assessment Mother states baby is breastfeeding well and frequently. Mother is pumping when able for additional stimulation. She reports baby latches well to the left breast and she and baby are still working on a better latch on the right. Mother is very committed and motivated to breast feed. good family support. Mom has pumped once and spoon fed. Baby has a short frenulum, tongue heart shapes but baby can extend her tongue to roof of mouth. Mother denies discomfort with latch and assistance with latch on the right breast was good with assistance. Informed to discuss pediatrician if she developes nipple trauma, poor weight gain. Presently mother is not experiencing any problems and baby is maintaining her latch.   Maternal Data    Feeding Feeding Type: Breast Milk Length of feed: 10 min  LATCH Score/Interventions Latch: Grasps breast easily, tongue down, lips flanged, rhythmical sucking.  Audible Swallowing: A few with stimulation  Type of Nipple: Everted at rest and after stimulation  Comfort (Breast/Nipple): Soft / non-tender     Hold (Positioning): No assistance needed to correctly position infant at breast. (mom states she is latching better to the left side)  LATCH Score: 9  Lactation Tools Discussed/Used     Consult Status Consult Status: PRN Date: 10/01/12 Follow-up type: In-patient    Christella Hartigan M 09/30/2012, 12:29 PM

## 2012-10-01 MED ORDER — MEDROXYPROGESTERONE ACETATE 150 MG/ML IM SUSP: 150.0000 mg | Freq: Once | INTRAMUSCULAR | Status: DC

## 2012-10-01 MED ORDER — OXYCODONE-ACETAMINOPHEN 5-325 MG PO TABS: 1.0000 | ORAL_TABLET | ORAL | Status: DC | PRN

## 2012-10-01 MED ORDER — IBUPROFEN 600 MG PO TABS: 600.0000 mg | ORAL_TABLET | Freq: Four times a day (QID) | ORAL | Status: DC

## 2012-10-01 NOTE — Discharge Summary (Signed)
  Obstetric Discharge Summary  Reason for Admission: induction of labor for Chronic hypertension on 09/25/12 Prenatal Procedures: none Intrapartum Procedures: cesarean: low cervical, transverse by Marylou Flesher MD on 09/29/12 for failed induction Postpartum Procedures: none Complications-Operative and Postpartum: none  Hemoglobin  Date Value Range Status  09/29/2012 10.1* 12.0 - 15.0 g/dL Final     DELTA CHECK NOTED     REPEATED TO VERIFY     HCT  Date Value Range Status  09/29/2012 29.6* 36.0 - 46.0 % Final    Discharge Diagnoses: Term Pregnancy-delivered  Discharge Information:  Date: 10/01/2012 Activity: unrestricted Diet: routine Medications: Ibuprofen, Colace and Percocet Condition: stable  Breastfeeding: yes  Instructions: refer to practice specific booklet Discharge to: home   Newborn Data: Live born  Information for the patient's newborn:  Zetha, Kuhar Girl Denessa [[id number]]  female  Home with mother.  Quentez Lober A MD 10/01/2012, 10:16 AM

## 2013-11-17 ENCOUNTER — Encounter (HOSPITAL_COMMUNITY): Payer: Self-pay | Admitting: Obstetrics and Gynecology

## 2014-06-08 ENCOUNTER — Emergency Department (HOSPITAL_COMMUNITY): Payer: 59

## 2014-06-08 ENCOUNTER — Emergency Department (HOSPITAL_COMMUNITY)
Admission: EM | Admit: 2014-06-08 | Discharge: 2014-06-08 | Disposition: A | Discharge status: Home / Self Care | Payer: 59 | Attending: Emergency Medicine | Admitting: Emergency Medicine

## 2014-06-08 DIAGNOSIS — Z79899 Other long term (current) drug therapy: Secondary | ICD-10-CM | POA: Insufficient documentation

## 2014-06-08 DIAGNOSIS — I10 Essential (primary) hypertension: Secondary | ICD-10-CM | POA: Diagnosis not present

## 2014-06-08 DIAGNOSIS — Z8614 Personal history of Methicillin resistant Staphylococcus aureus infection: Secondary | ICD-10-CM | POA: Insufficient documentation

## 2014-06-08 DIAGNOSIS — H4710 Unspecified papilledema: Secondary | ICD-10-CM | POA: Insufficient documentation

## 2014-06-08 DIAGNOSIS — G932 Benign intracranial hypertension: Secondary | ICD-10-CM | POA: Diagnosis not present

## 2014-06-08 DIAGNOSIS — Z3202 Encounter for pregnancy test, result negative: Secondary | ICD-10-CM | POA: Diagnosis not present

## 2014-06-08 DIAGNOSIS — Z8619 Personal history of other infectious and parasitic diseases: Secondary | ICD-10-CM | POA: Insufficient documentation

## 2014-06-08 DIAGNOSIS — E669 Obesity, unspecified: Secondary | ICD-10-CM | POA: Insufficient documentation

## 2014-06-08 DIAGNOSIS — G43909 Migraine, unspecified, not intractable, without status migrainosus: Secondary | ICD-10-CM | POA: Insufficient documentation

## 2014-06-08 LAB — CBC WITH DIFFERENTIAL/PLATELET
BASOS PCT: 0 % (ref 0–1)
Basophils Absolute: 0 10*3/uL (ref 0.0–0.1)
Eosinophils Absolute: 0.3 10*3/uL (ref 0.0–0.7)
Eosinophils Relative: 4 % (ref 0–5)
HEMATOCRIT: 39.6 % (ref 36.0–46.0)
HEMOGLOBIN: 12.6 g/dL (ref 12.0–15.0)
LYMPHS ABS: 2.8 10*3/uL (ref 0.7–4.0)
Lymphocytes Relative: 38 % (ref 12–46)
MCH: 26.8 pg (ref 26.0–34.0)
MCHC: 31.8 g/dL (ref 30.0–36.0)
MCV: 84.3 fL (ref 78.0–100.0)
Monocytes Absolute: 0.5 10*3/uL (ref 0.1–1.0)
Monocytes Relative: 7 % (ref 3–12)
NEUTROS PCT: 51 % (ref 43–77)
Neutro Abs: 3.7 10*3/uL (ref 1.7–7.7)
Platelets: 291 10*3/uL (ref 150–400)
RBC: 4.7 MIL/uL (ref 3.87–5.11)
RDW: 13.6 % (ref 11.5–15.5)
WBC: 7.2 10*3/uL (ref 4.0–10.5)

## 2014-06-08 LAB — GRAM STAIN: GRAM STAIN: NONE SEEN

## 2014-06-08 LAB — BASIC METABOLIC PANEL
ANION GAP: 7 (ref 5–15)
BUN: 10 mg/dL (ref 6–20)
CALCIUM: 9 mg/dL (ref 8.9–10.3)
CHLORIDE: 103 mmol/L (ref 101–111)
CO2: 28 mmol/L (ref 22–32)
Creatinine, Ser: 0.79 mg/dL (ref 0.44–1.00)
GFR calc non Af Amer: >60 mL/min (ref >60)
Glucose, Bld: 86 mg/dL (ref 65–99)
POTASSIUM: 3.8 mmol/L (ref 3.5–5.1)
Sodium: 138 mmol/L (ref 135–145)

## 2014-06-08 LAB — HEPATIC FUNCTION PANEL
ALT: 15 U/L (ref 14–54)
AST: 15 U/L (ref 15–41)
Albumin: 3.8 g/dL (ref 3.5–5.0)
Alkaline Phosphatase: 72 U/L (ref 38–126)
Bilirubin, Direct: <0.1 mg/dL — ABNORMAL LOW (ref 0.1–0.5)
Total Bilirubin: 0.4 mg/dL (ref 0.3–1.2)
Total Protein: 7.2 g/dL (ref 6.5–8.1)

## 2014-06-08 LAB — CSF CELL COUNT WITH DIFFERENTIAL
RBC COUNT CSF: 206 /mm3 — AB
Tube #: 4
WBC, CSF: 1 /mm3 (ref 0–5)

## 2014-06-08 LAB — I-STAT CREATININE, ED
Creatinine, Ser: 0.8 mg/dL (ref 0.44–1.00)
Creatinine, Ser: 0.8 mg/dL (ref 0.44–1.00)

## 2014-06-08 LAB — GLUCOSE, CSF: Glucose, CSF: 56 mg/dL (ref 40–70)

## 2014-06-08 LAB — TSH: TSH: 1.077 u[IU]/mL (ref 0.350–4.500)

## 2014-06-08 LAB — PROTEIN, CSF: Total  Protein, CSF: 15 mg/dL (ref 15–45)

## 2014-06-08 LAB — I-STAT BETA HCG BLOOD, ED (MC, WL, AP ONLY): I-stat hCG, quantitative: <5 m[IU]/mL (ref <5)

## 2014-06-08 MED ORDER — GADOBENATE DIMEGLUMINE 529 MG/ML IV SOLN
20.0000 mL | Freq: Once | INTRAVENOUS | Status: AC | PRN
  Administered 2014-06-08: 20 mL via INTRAVENOUS

## 2014-06-08 MED ORDER — ACETAZOLAMIDE 125 MG PO TABS: 125.0000 mg | ORAL_TABLET | Freq: Two times a day (BID) | ORAL | Status: AC

## 2014-06-08 MED ORDER — LABETALOL HCL 5 MG/ML IV SOLN
10.0000 mg | Freq: Once | INTRAVENOUS | Status: AC
  Administered 2014-06-08: 10 mg via INTRAVENOUS
  Filled 2014-06-08: qty 4

## 2014-06-08 NOTE — ED Notes (Signed)
Patient is out the room went to CT.  I will collect labs when she returns.

## 2014-06-08 NOTE — ED Notes (Signed)
Patient transported to MRI 

## 2014-06-08 NOTE — ED Provider Notes (Signed)
Patient seen and evaluated. I discussed the LP results with radiology. Opening pressure 33. 18 mL of CSF removed. Reassuring studies. On reexam her headache has resolved. I discussed with her remaining supine tonight and tomorrow. Prescription for Diamox. [place] neurological follow-up. ER with any changes.  Rolland PorterMark Milayah Krell, MD 06/08/14 [phone]

## 2014-06-08 NOTE — ED Notes (Signed)
Patient still in MRI.  

## 2014-06-08 NOTE — ED Provider Notes (Signed)
CSN: [id number]     Arrival date & time 06/08/14  1104 History   First MD Initiated Contact with Patient 06/08/14 1115     Chief Complaint  Patient presents with  . Optic Nerve Edema      (Consider location/radiation/quality/duration/timing/severity/associated sxs/prior Treatment) HPI Comments: 29 year old female with history of high blood pressure, C-section September 2014, migraines presents from ophthalmologist office for significant bilateral papilledema new diagnosis for patient. Patient was going in for routine eye exam for her glasses. No significant vision changes recently no double vision, no neurologic complaints. Patient feels well otherwise. Patient has had difficulty controlling blood pressure the past 2 years since her pregnancy. Patient has had weight issues since her last pain sees well. Migraines intermittent every few days she has headaches. She's never been diagnosed with intracranial hypertension. No neurologic history no MS history. Patient is a nonsmoker social alcohol. Patient says she takes her blood pressure meds regularly. Blood pressure normally 160 systolic she says.  The history is provided by the patient.    Past Medical History  Diagnosis Date  . Peanut allergy   . Hypertension   . Migraines     Typcially takes Excedrin or BC powder  . Abnormal Pap smear     Had rpt pap;was normal;Last pap 08/2011 was normal  . MRSA (methicillin resistant staph aureus) culture positive     Was rx'd doxycycline x 2;found under arm and inner thigh  . Chlamydia 2012    Was treated and partner treated  . Obese 03/07/2012  . Pseudotumor cerebri 06/17/2014   Past Surgical History  Procedure Laterality Date  . Wisdom tooth extraction  2006    All 4 removed  . Tonsillectomy  2007  . Cesarean section N/A 09/28/2012    Procedure: Primary cesarean section with delivery of baby girl at 0907.  Apgars 9/10.;  Surgeon: Geryl Rankins, MD;  Location: [facility];  Service: Obstetrics;   Laterality: N/A;   Family History  Problem Relation Age of Onset  . Cancer Maternal Grandmother     Deceased  . Hypertension Mother   . Migraines Mother   . Fibroids Mother   . Hypertension Father   . Anemia Sister     x 2  . Diabetes type II Maternal Grandfather   . Diabetes type I Other     Niece  . Migraines Sister     x 3  . Hypertension Sister     x 2   History  Substance Use Topics  . Smoking status: Never Smoker   . Smokeless tobacco: Never Used  . Alcohol Use: 1.8 - 2.4 oz/week    3-4 Standard drinks or equivalent per week   OB History    Gravida Para Term Preterm AB TAB SAB Ectopic Multiple Living   Review of Systems  Constitutional: Negative for fever and chills.  HENT: Negative for congestion.   Eyes: Negative for pain and visual disturbance.  Respiratory: Negative for shortness of breath.   Cardiovascular: Negative for chest pain.  Gastrointestinal: Negative for vomiting and abdominal pain.  Genitourinary: Negative for dysuria and flank pain.  Musculoskeletal: Negative for back pain, neck pain and neck stiffness.  Skin: Negative for rash.  Neurological: Positive for headaches. Negative for light-headedness.      Allergies  Peanuts  Home Medications   Prior to Admission medications   Medication Sig Start Date End Date Taking? Authorizing Provider  amLODipine (NORVASC) 5 MG tablet Take 5 mg by mouth daily.   Yes Historical Provider, MD  triamterene-hydrochlorothiazide (DYAZIDE) 37.5-25 MG per capsule Take 1 capsule by mouth daily.   Yes Historical Provider, MD  acetaZOLAMIDE (DIAMOX) 125 MG tablet Take 1 tablet (125 mg total) by mouth 2 (two) times daily. 06/08/14   Rolland PorterMark James, MD  Aspirin-Acetaminophen-Caffeine (GOODYS EXTRA STRENGTH PO) Take 1 packet by mouth daily as needed (headache).    Historical Provider, MD  HYDROcodone-acetaminophen (NORCO/VICODIN) 5-325 MG per tablet Take 1-2 tablets every 6 hours as needed for severe pain  06/11/14   Renne CriglerJoshua Geiple, PA-C   BP 135/86 mmHg  Pulse 70  Temp(Src) 98.6 F (37 C) (Oral)  Resp 16  SpO2 100%  LMP 06/08/2014 Physical Exam  Constitutional: She is oriented to person, place, and time. She appears well-developed and well-nourished.  HENT:  Head: Normocephalic and atraumatic.  Papilledema bilateral  Eyes: Conjunctivae are normal. Right eye exhibits no discharge. Left eye exhibits no discharge.  Neck: Normal range of motion. Neck supple. No tracheal deviation present.  Cardiovascular: Normal rate and regular rhythm.   Pulmonary/Chest: Effort normal and breath sounds normal.  Abdominal: Soft. She exhibits no distension. There is no tenderness. There is no guarding.  Musculoskeletal: She exhibits no edema or tenderness.  Neurological: She is alert and oriented to person, place, and time.  5+ strength in UE and LE with f/e at major joints. Sensation to palpation intact in UE and LE. CNs 2-12 grossly intact.  EOMFI.  PERRL.   Finger nose and coordination intact bilateral.   Visual fields intact to finger testing.   Skin: Skin is warm. No rash noted.  Psychiatric: She has a normal mood and affect.  Nursing note and vitals reviewed.   ED Course  Procedures (including critical care time) Labs Review Labs Reviewed  HEPATIC FUNCTION PANEL - Abnormal; Notable for the following:    Bilirubin, Direct <0.1 (*)    All other components within normal limits  CSF CELL COUNT WITH DIFFERENTIAL - Abnormal; Notable for the following:    RBC Count, CSF 206 (*)    All other components within normal limits  GRAM STAIN  CSF CULTURE  CSF CULTURE  GRAM STAIN  CBC WITH DIFFERENTIAL/PLATELET  RPR  FLUORESCENT TREPONEMAL AB(FTA)-IGG-BLD  RHEUMATOID FACTOR  B. BURGDORFI ANTIBODIES  BASIC METABOLIC PANEL  HIV ANTIBODY (ROUTINE TESTING)  TSH  GLUCOSE, CSF  PROTEIN, CSF  I-STAT BETA HCG BLOOD, ED ([facility], [facility], [facility] ONLY)  I-STAT CREATININE, ED  I-STAT CREATININE, ED    Imaging  Review No results found.   EKG Interpretation None      MDM   Final diagnoses:  Optic nerve edema  Papilledema  Pseudotumor cerebri   Patient sent to the ER for further evaluation of bilateral papilledema, MRI MRV ordered of brain and orbits. Patient denies any new neurologic complaints at this time, vision similar to previous she wears glasses. Blood work sent per ophthalmology request. Bedside ultrasound confirmed papilledema, discussed with neurology who agrees with MRI workup. Normal neurologic exam except for papilledema in the ER.  Concern for uncontrolled htn, pseudotumor or other neurologic differential. Less likely MS with no neuro sxs.   Discussed with neuro.  Rec LP, radiology consulted for assistance. Pt signed out to fup results and final dispo, likely outpt neuro fup.      Blane OharaJoshua Leiam Hopwood, MD 07/07/14 2227

## 2014-06-08 NOTE — ED Notes (Signed)
Patient is currently lying flat. Radiology informed that the radiologist wanted the patient to lie flat for 4-6 hours.

## 2014-06-08 NOTE — Discharge Instructions (Signed)
Rest supine tonight, and tomorrow. Begin your prescription of Diamox tomorrow. Call for follow up with neurologist at number above.   Idiopathic Intracranial Hypertension Idiopathic intracranial hypertension (IIH) is a neurologic disorder that leads to increased pressure around your brain. It can cause vision loss and blindness if left untreated. RISK FACTORS IIH is most common in very overweight (obese) women of childbearing age. SIGNS AND SYMPTOMS  Symptoms of IIH include:  Headache.  Feeling of sickness in your stomach (nausea).  Vomiting.  A "rushing of water" sound within your ears (pulsatile tinnitus).  Double vision. DIAGNOSIS  Idiopathic intracranial hypertension is diagnosed with the aid of different exams:  Brain scans such as:  CT.  MRI.  MRV.  Diagnostic lumbar puncture. This procedure can determine if there is too much spinal fluid within the central nervous system. Too much spinal fluid can increase intracranial pressure.  A thorough eye exam will be done to look for swelling within the eyes. Visual field testing will also be done to see if any damage has occurred to nerves in the eyes. TREATMENT  Treatment of idiopathic intracranial hypertension is based on symptoms. Common treatments include:  Lumbar puncture to remove excess spinal fluid.  Medicine.  Surgery. HOME CARE INSTRUCTIONS The most important thing anyone can do to improve this condition is lose weight if they are overweight.  SEEK MEDICAL CARE IF:  You have changes in vision.  You have double vision.  You have loss of color vision. SEEK IMMEDIATE MEDICAL CARE IF:   Your headaches get worse rather than better.  Nausea or vomiting or both continue after treatment.  Your vision does not improve or gets worse after treatment. MAKE SURE YOU:  Understand these instructions.  Will watch your condition.  Will get help right away if you are not doing well or get worse. Document  Released: 03/13/2001 Document Revised: 01/07/2013 Document Reviewed: 09/09/2012 [facility] Patient Information 2015 [employer], [place]. This information is not intended to replace advice given to you by your health care provider. Make sure you discuss any questions you have with your health care provider.

## 2014-06-08 NOTE — ED Notes (Signed)
Pt sent for ophthalmologist after noting advanced bilateral optic nerve edema. MD requests MRI/MRV of brain/orbits, CMC with differential, ANA, RF, Lyme titer, RPR, FTA-abs, quantiferron gold blood labs. Pt c/o blurred vision, states she did have eyes dilated at appointment, denies blurred vision prior to dilation.

## 2014-06-09 ENCOUNTER — Telehealth (HOSPITAL_COMMUNITY): Payer: Self-pay

## 2014-06-09 LAB — RPR: RPR Ser Ql: NONREACTIVE

## 2014-06-09 LAB — HIV ANTIBODY (ROUTINE TESTING W REFLEX): HIV SCREEN 4TH GENERATION: NONREACTIVE

## 2014-06-09 NOTE — Telephone Encounter (Signed)
Lab called. Unable to run Quantitative TB gold test.  Dr Rubin PayorPickering notified. No further follow up needed at this time.

## 2014-06-10 LAB — RHEUMATOID FACTOR: RHEUMATOID FACTOR: 7.8 [IU]/mL (ref 0.0–13.9)

## 2014-06-10 LAB — FLUORESCENT TREPONEMAL AB(FTA)-IGG-BLD: FLUORESCENT TREPONEMAL AB, IGG: NONREACTIVE

## 2014-06-10 LAB — B. BURGDORFI ANTIBODIES: B burgdorferi Ab IgG+IgM: <0.91 {ISR} (ref 0.00–0.90)

## 2014-06-11 ENCOUNTER — Encounter (HOSPITAL_COMMUNITY): Payer: Self-pay | Admitting: Emergency Medicine

## 2014-06-11 ENCOUNTER — Emergency Department (HOSPITAL_COMMUNITY)
Admission: EM | Admit: 2014-06-11 | Discharge: 2014-06-12 | Disposition: A | Discharge status: Home / Self Care | Payer: 59 | Attending: Emergency Medicine | Admitting: Emergency Medicine

## 2014-06-11 DIAGNOSIS — R51 Headache: Secondary | ICD-10-CM | POA: Diagnosis present

## 2014-06-11 DIAGNOSIS — Z8619 Personal history of other infectious and parasitic diseases: Secondary | ICD-10-CM | POA: Diagnosis not present

## 2014-06-11 DIAGNOSIS — I10 Essential (primary) hypertension: Secondary | ICD-10-CM | POA: Diagnosis not present

## 2014-06-11 DIAGNOSIS — G971 Other reaction to spinal and lumbar puncture: Secondary | ICD-10-CM | POA: Diagnosis not present

## 2014-06-11 DIAGNOSIS — E669 Obesity, unspecified: Secondary | ICD-10-CM | POA: Diagnosis not present

## 2014-06-11 DIAGNOSIS — Z79899 Other long term (current) drug therapy: Secondary | ICD-10-CM | POA: Diagnosis not present

## 2014-06-11 DIAGNOSIS — Z8614 Personal history of Methicillin resistant Staphylococcus aureus infection: Secondary | ICD-10-CM | POA: Diagnosis not present

## 2014-06-11 LAB — CBC
HCT: 39.9 % (ref 36.0–46.0)
Hemoglobin: 12.5 g/dL (ref 12.0–15.0)
MCH: 26.5 pg (ref 26.0–34.0)
MCHC: 31.3 g/dL (ref 30.0–36.0)
MCV: 84.7 fL (ref 78.0–100.0)
PLATELETS: 282 10*3/uL (ref 150–400)
RBC: 4.71 MIL/uL (ref 3.87–5.11)
RDW: 13.7 % (ref 11.5–15.5)
WBC: 8.5 10*3/uL (ref 4.0–10.5)

## 2014-06-11 LAB — BASIC METABOLIC PANEL
Anion gap: 7 (ref 5–15)
BUN: 14 mg/dL (ref 6–20)
CO2: 23 mmol/L (ref 22–32)
Calcium: 9.3 mg/dL (ref 8.9–10.3)
Chloride: 108 mmol/L (ref 101–111)
Creatinine, Ser: 0.76 mg/dL (ref 0.44–1.00)
GFR calc Af Amer: >60 mL/min (ref >60)
GFR calc non Af Amer: >60 mL/min (ref >60)
GLUCOSE: 95 mg/dL (ref 65–99)
Potassium: 3.9 mmol/L (ref 3.5–5.1)
SODIUM: 138 mmol/L (ref 135–145)

## 2014-06-11 MED ORDER — FENTANYL CITRATE (PF) 100 MCG/2ML IJ SOLN
50.0000 ug | Freq: Once | INTRAMUSCULAR | Status: AC
  Administered 2014-06-11: 50 ug via INTRAVENOUS
  Filled 2014-06-11: qty 2

## 2014-06-11 MED ORDER — DIPHENHYDRAMINE HCL 50 MG/ML IJ SOLN
25.0000 mg | Freq: Once | INTRAMUSCULAR | Status: AC
  Administered 2014-06-11: 25 mg via INTRAVENOUS
  Filled 2014-06-11: qty 1

## 2014-06-11 MED ORDER — METOCLOPRAMIDE HCL 5 MG/ML IJ SOLN
10.0000 mg | Freq: Once | INTRAMUSCULAR | Status: AC
  Administered 2014-06-11: 10 mg via INTRAVENOUS
  Filled 2014-06-11: qty 2

## 2014-06-11 MED ORDER — HYDROCODONE-ACETAMINOPHEN 5-325 MG PO TABS: ORAL_TABLET | ORAL | Status: AC

## 2014-06-11 NOTE — Discharge Instructions (Signed)
Please read and follow all provided instructions.  Your diagnoses today include:  1. Post lumbar puncture headache     Tests performed today include:  Vital signs. See below for your results today.   Medications:  In the Emergency Department you received:  Reglan - antinausea/headache medication  Benadryl - antihistamine to counteract potential side effects of reglan  Fentanyl - pain medication  Take any prescribed medications only as directed.  Additional information:  Follow any educational materials contained in this packet.  Your headache today does not appear to be life-threatening or require hospitalization, but often the exact cause of headaches is not determined in the emergency department. Therefore, follow-up with your doctor is very important to find out what may have caused your headache and whether or not you need any further diagnostic testing or treatment.   Sometimes headaches can appear benign (not harmful), but then more serious symptoms can develop which should prompt an immediate re-evaluation by your doctor or the emergency department.  BE VERY CAREFUL not to take multiple medicines containing Tylenol (also called acetaminophen). Doing so can lead to an overdose which can damage your liver and cause liver failure and possibly death.   Follow-up instructions: Please follow-up with your primary care provider in the next 3 days for further evaluation of your symptoms.   Return instructions:   Please return to the Emergency Department if you experience worsening symptoms.  Return if the medications do not resolve your headache, if it recurs, or if you have multiple episodes of vomiting or cannot keep down fluids.  Return if you have a change from the usual headache.  RETURN IMMEDIATELY IF you:  Develop a sudden, severe headache  Develop confusion or become poorly responsive or faint  Develop a fever above 100.76F or problem breathing  Have a change in  speech, vision, swallowing, or understanding  Develop new weakness, numbness, tingling, incoordination in your arms or legs  Have a seizure  Please return if you have any other emergent concerns.  Additional Information:  Your vital signs today were: BP 137/84 mmHg   Pulse 74   Temp(Src) 98.3 F (36.8 C) (Oral)   Resp 18   SpO2 100%   LMP 06/08/2014 If your blood pressure (BP) was elevated above 135/85 this visit, please have this repeated by your doctor within one month. --------------

## 2014-06-11 NOTE — ED Notes (Signed)
Pt states she came Monday for "swollen nerves in both of her eyes," and states she had a lumbar puncture. States now her back is hurting her, she's having blurred vision, and that she's having the worst migraine of her life. Denies N/V/D, fever, chills. Appears to be a healed scar to lower back where LP was preformed.

## 2014-06-11 NOTE — ED Provider Notes (Signed)
CSN: [id number]     Arrival date & time 06/11/14  1954 History   First MD Initiated Contact with Patient 06/11/14 2216     Chief Complaint  Patient presents with  . Migraine  . Blurred Vision     (Consider location/radiation/quality/duration/timing/severity/associated sxs/prior Treatment) HPI Comments: Patient with history of pseudotumor cerebri presents with complaint of headache for approximately 24 hours. Patient was diagnosed with pseudotumor 3 days ago and had workup in the ED including lumbar puncture which showed an opening pressure of 33 with 18 mL of CSF removed, MRI brain, MRI orbits and MRV of brain which were otherwise negative. Patient did well the following 2 days but began with a mild headache yesterday which gradually worsened today. Patient has migrating pain over the front of her head with associated photophobia. Pain is also worse with sitting up and slightly better lying flat. Also with mild blurry vision but no vision loss. No nausea or vomiting. No weakness, numbness, or tingling in her extremities. No fevers or neck pain. Patient does have some soreness in her lower back at the site of the lumbar puncture. No difficulty with walking or confusion. Patient took 2 800 mg ibuprofen tablets during the day prior to arrival.   Patient is a 29 y.o. female presenting with migraines. The history is provided by the patient.  Migraine Associated symptoms include headaches. Pertinent negatives include no chest pain, congestion, fever, nausea, neck pain, numbness, rash, vomiting or weakness.    Past Medical History  Diagnosis Date  . Peanut allergy   . Hypertension   . Migraines     Typcially takes Excedrin or BC powder  . Abnormal Pap smear     Had rpt pap;was normal;Last pap 08/2011 was normal  . MRSA (methicillin resistant staph aureus) culture positive     Was rx'd doxycycline x 2;found under arm and inner thigh  . Chlamydia 2012    Was treated and partner treated  . Obese  03/07/2012   Past Surgical History  Procedure Laterality Date  . Wisdom tooth extraction  2006    All 4 removed  . Tonsillectomy  2007  . Cesarean section N/A 09/28/2012    Procedure: Primary cesarean section with delivery of baby girl at 0907.  Apgars 9/10.;  Surgeon: Geryl Rankins, MD;  Location: [facility];  Service: Obstetrics;  Laterality: N/A;   Family History  Problem Relation Age of Onset  . Cancer Maternal Grandmother     Deceased  . Hypertension Mother   . Hypertension Father   . Anemia Sister     x 2  . Diabetes type II Maternal Grandfather   . Diabetes type I Other     Niece  . Migraines Mother   . Migraines Sister     x 3  . Hypertension Sister     x 2  . Fibroids Mother    History  Substance Use Topics  . Smoking status: Never Smoker   . Smokeless tobacco: Never Used  . Alcohol Use: Yes     Comment: Rarely before pregnancy   OB History    Gravida Para Term Preterm AB TAB SAB Ectopic Multiple Living   Review of Systems  Constitutional: Negative for fever.  HENT: Negative for congestion, dental problem, rhinorrhea and sinus pressure.   Eyes: Positive for photophobia and visual disturbance. Negative for discharge and redness.  Respiratory: Negative for shortness of breath.  Cardiovascular: Negative for chest pain.  Gastrointestinal: Negative for nausea and vomiting.  Musculoskeletal: Negative for gait problem, neck pain and neck stiffness.  Skin: Negative for rash.  Neurological: Positive for headaches. Negative for syncope, speech difficulty, weakness, light-headedness and numbness.  Psychiatric/Behavioral: Negative for confusion.    Allergies  Peanuts  Home Medications   Prior to Admission medications   Medication Sig Start Date End Date Taking? Authorizing Provider  acetaZOLAMIDE (DIAMOX) 125 MG tablet Take 1 tablet (125 mg total) by mouth 2 (two) times daily. 06/08/14   Rolland PorterMark James, MD  amLODipine (NORVASC) 5 MG tablet Take 5 mg  by mouth daily.    Historical Provider, MD  cetirizine (ZYRTEC) 10 MG tablet Take 1 tablet (10 mg total) by mouth daily. Patient not taking: Reported on 06/08/2014 03/25/12   Elane FritzLakasha Carter, FNP  ibuprofen (ADVIL,MOTRIN) 600 MG tablet Take 1 tablet (600 mg total) by mouth every 6 (six) hours. Patient not taking: Reported on 06/08/2014 10/01/12   Silverio LaySandra Rivard, MD  oxyCODONE-acetaminophen (PERCOCET/ROXICET) 5-325 MG per tablet Take 1-2 tablets by mouth every 4 (four) hours as needed. Patient not taking: Reported on 06/08/2014 10/01/12   Silverio LaySandra Rivard, MD  triamterene-hydrochlorothiazide (DYAZIDE) 37.5-25 MG per capsule Take 1 capsule by mouth daily.    Historical Provider, MD   BP 144/101 mmHg  Pulse 83  Temp(Src) 98.3 F (36.8 C) (Oral)  Resp 18  SpO2 100%  LMP 06/08/2014   Physical Exam  Constitutional: She is oriented to person, place, and time. She appears well-developed and well-nourished.  HENT:  Head: Normocephalic and atraumatic.  Right Ear: Tympanic membrane, external ear and ear canal normal.  Left Ear: Tympanic membrane, external ear and ear canal normal.  Nose: Nose normal.  Mouth/Throat: Uvula is midline, oropharynx is clear and moist and mucous membranes are normal.  Eyes: Conjunctivae, EOM and lids are normal. Pupils are equal, round, and reactive to light. Right eye exhibits no nystagmus. Left eye exhibits no nystagmus.  Neck: Normal range of motion. Neck supple.  Cardiovascular: Normal rate and regular rhythm.   Pulmonary/Chest: Effort normal and breath sounds normal.  Abdominal: Soft. There is no tenderness.  Musculoskeletal: She exhibits tenderness.       Cervical back: She exhibits normal range of motion, no tenderness and no bony tenderness.  Mild tenderness over the lower L-spine at site of previous LP without any bruising, swelling, or drainage.   Neurological: She is alert and oriented to person, place, and time. She has normal strength and normal reflexes. No  cranial nerve deficit or sensory deficit. She displays a negative Romberg sign. Coordination and gait normal. GCS eye subscore is 4. GCS verbal subscore is 5. GCS motor subscore is 6.  Skin: Skin is warm and dry.  Psychiatric: She has a normal mood and affect.  Nursing note and vitals reviewed.   ED Course  Procedures (including critical care time) Labs Review Labs Reviewed  CBC  BASIC METABOLIC PANEL    Imaging Review No results found.   EKG Interpretation None       10:31 PM Patient seen and examined. Work-up initiated. Medications ordered. Discussed with Dr. Linwood DibblesJon Knapp who agrees with plan for symptomatic treatment.   Vital signs reviewed and are as follows: BP 144/101 mmHg  Pulse 83  Temp(Src) 98.3 F (36.8 C) (Oral)  Resp 18  SpO2 100%  LMP 06/08/2014  11:55 PM patient feeling much better after treatment. Will discharge to home. Patient encouraged to drink caffeine and rest.  Discussed return to the ED during business hours if headache continues for possible blood patch.  Patient counseled to return if they have weakness in their arms or legs, slurred speech, trouble walking or talking, confusion, trouble with their balance, or if they have any other concerns. Patient verbalizes understanding and agrees with plan.    MDM   Final diagnoses:  Post lumbar puncture headache   Patient with suspected PLPHA. Patient without high-risk features of headache including: sudden onset/thunderclap HA, no similar headache in past, altered mental status, accompanying seizure, headache with exertion, age > 18, history of immunocompromise, neck or shoulder pain, fever, use of anticoagulation, family history of spontaneous SAH, concomitant drug use, toxic exposure.   Patient has a normal complete neurological exam, normal vital signs, normal level of consciousness, no signs of meningismus, is well-appearing/non-toxic appearing, no signs of trauma.   Repeat imaging with CT/MRI not  indicated given history and physical exam findings. Symptoms improved with tx here in ED.   No dangerous or life-threatening conditions suspected or identified by history, physical exam, and by work-up. No indications for hospitalization identified.      Renne Crigler, PA-C 06/12/14 0001  Linwood Dibbles, MD 06/13/14 [phone]

## 2014-06-12 LAB — CSF CULTURE W GRAM STAIN: Gram Stain: NONE SEEN

## 2014-06-12 LAB — CSF CULTURE: CULTURE: NO GROWTH

## 2014-06-12 NOTE — ED Notes (Signed)
Pt ambulating independently w/ steady gait on d/c in no acute distress, A&Ox4. Rx given x1 D/c instructions reviewed w/ pt and family - pt and family deny any further questions or concerns at present.   

## 2014-06-17 ENCOUNTER — Encounter: Payer: Self-pay | Admitting: Neurology

## 2014-06-17 ENCOUNTER — Ambulatory Visit (INDEPENDENT_AMBULATORY_CARE_PROVIDER_SITE_OTHER): Payer: 59 | Admitting: Neurology

## 2014-06-17 VITALS — BP 137/89 | HR 87 | Ht 61.0 in | Wt 251.8 lb

## 2014-06-17 DIAGNOSIS — G932 Benign intracranial hypertension: Secondary | ICD-10-CM | POA: Diagnosis not present

## 2014-06-17 HISTORY — DX: Benign intracranial hypertension: G93.2

## 2014-06-17 NOTE — Progress Notes (Signed)
Reason for visit: Pseudotumor cerebri  Referring physician: [place]  Karen Ashley is a 29 y.o. female  History of present illness:  Karen Ashley is a [age] year old right-handed black female with a history of headaches since a C-section delivery of her child in September 2014. The patient gained weight from around 214 to a weight of 288 with the pregnancy. The patient currently weighs around 250 pounds. She has had headaches that have been primarily in the left frontotemporal area, but the headaches have been daily. The patient usually is better in the morning, worse as the day goes on. She indicates that the headaches are associated with photophobia, but with no nausea vomiting or visual disturbances. The patient has not had any weakness or numbness on the face, arms, or legs. The patient went in for a routine eye examination, and her optometrist noted papilledema. The patient was sent to Dr. Earlene Plateravis for further evaluation, and the papilledema was confirmed. A workup in the emergency room revealed a normal MRI and MRV of the head, and lumbar puncture was done showing an opening pressure of 33 cm with relatively normal spinal fluid analysis. The patient has been placed on Diamox, and she is sent to this office for an evaluation. She has recently moved to this area, she does not have a primary care physician. The patient does not take birth control pills, but she is on Depo-Provera.  Past Medical History  Diagnosis Date  . Peanut allergy   . Hypertension   . Migraines     Typcially takes Excedrin or BC powder  . Abnormal Pap smear     Had rpt pap;was normal;Last pap 08/2011 was normal  . MRSA (methicillin resistant staph aureus) culture positive     Was rx'd doxycycline x 2;found under arm and inner thigh  . Chlamydia 2012    Was treated and partner treated  . Obese 03/07/2012  . Pseudotumor cerebri 06/17/2014    Past Surgical History  Procedure Laterality Date  . Wisdom tooth extraction  2006   All 4 removed  . Tonsillectomy  2007  . Cesarean section N/A 09/28/2012    Procedure: Primary cesarean section with delivery of baby girl at 0907.  Apgars 9/10.;  Surgeon: Geryl RankinsEvelyn Varnado, MD;  Location: [facility];  Service: Obstetrics;  Laterality: N/A;    Family History  Problem Relation Age of Onset  . Cancer Maternal Grandmother     Deceased  . Hypertension Mother   . Migraines Mother   . Fibroids Mother   . Hypertension Father   . Anemia Sister     x 2  . Diabetes type II Maternal Grandfather   . Diabetes type I Other     Niece  . Migraines Sister     x 3  . Hypertension Sister     x 2    Social history:  reports that she has never smoked. She has never used smokeless tobacco. She reports that she drinks about 1.8 - 2.4 oz of alcohol per week. She reports that she does not use illicit drugs.  Medications:  Prior to Admission medications   Medication Sig Start Date End Date Taking? Authorizing Provider  acetaZOLAMIDE (DIAMOX) 125 MG tablet Take 1 tablet (125 mg total) by mouth 2 (two) times daily. 06/08/14  Yes Rolland PorterMark James, MD  amLODipine (NORVASC) 5 MG tablet Take 5 mg by mouth daily.   Yes Historical Provider, MD  Aspirin-Acetaminophen-Caffeine (GOODYS EXTRA STRENGTH PO) Take 1 packet by mouth  daily as needed (headache).   Yes Historical Provider, MD  HYDROcodone-acetaminophen (NORCO/VICODIN) 5-325 MG per tablet Take 1-2 tablets every 6 hours as needed for severe pain 06/11/14  Yes Renne Crigler, PA-C  triamterene-hydrochlorothiazide (DYAZIDE) 37.5-25 MG per capsule Take 1 capsule by mouth daily.   Yes Historical Provider, MD      Allergies  Allergen Reactions  . Peanuts [Peanut Oil] Other (See Comments)    Shortness of breath     ROS:  Out of a complete 14 system review of symptoms, the patient complains only of the following symptoms, and all other reviewed systems are negative.  Weight gain Blurred vision Allergies Headache, weakness Not enough sleep, decreased  energy, change in appetite Insomnia  Blood pressure 137/89, pulse 87, height  (1.549 m), weight 251 lb 12.8 oz (114.216 kg), last menstrual period 06/08/2014, not currently breastfeeding.  Physical Exam  General: The patient is alert and cooperative at the time of the examination. The patient is markedly obese.  Eyes: Pupils are equal, round, and reactive to light. Discs are notable for some blurring of margins medially on both sides.  Neck: The neck is supple, no carotid bruits are noted.  Respiratory: The respiratory examination is clear.  Cardiovascular: The cardiovascular examination reveals a regular rate and rhythm, no obvious murmurs or rubs are noted.  Skin: Extremities are without significant edema.  Neurologic Exam  Mental status: The patient is alert and oriented x 3 at the time of the examination. The patient has apparent normal recent and remote memory, with an apparently normal attention span and concentration ability.  Cranial nerves: Facial symmetry is present. There is good sensation of the face to pinprick and soft touch bilaterally. The strength of the facial muscles and the muscles to head turning and shoulder shrug are normal bilaterally. Speech is well enunciated, no aphasia or dysarthria is noted. Extraocular movements are full. Visual fields are full. The tongue is midline, and the patient has symmetric elevation of the soft palate. No obvious hearing deficits are noted.  Motor: The motor testing reveals 5 over 5 strength of all 4 extremities. Good symmetric motor tone is noted throughout.  Sensory: Sensory testing is intact to pinprick, soft touch, vibration sensation, and position sense on all 4 extremities. No evidence of extinction is noted.  Coordination: Cerebellar testing reveals good finger-nose-finger and heel-to-shin bilaterally.  Gait and station: Gait is normal. Tandem gait is normal. Romberg is negative. No drift is seen.  Reflexes: Deep  tendon reflexes are symmetric and normal bilaterally. Toes are downgoing bilaterally.   MRI brain 06/08/14:  IMPRESSION: Papilledema without intraorbital intracranial mass lesion. Imaging findings are most consistent with idiopathic intracranial hypertension (IIH).  Partial empty sella and tonsillar ectopia are secondary findings of IIH; see comments above.  No evidence for venous thrombosis.  * MRI scan images were reviewed online. I agree with the written report.     Assessment/Plan:  1. Pseudotumor cerebri  2. Obesity  The patient is now on Diamox taking 125 mg twice daily. She will remain on this dose, if she is still having headaches within the next 3 or 4 weeks, she will call me, we will go up on the dose. The patient will follow-up in 3-4 months. It is possible that she has migraine headaches as well as the pseudotumor cerebri. I have encouraged her to enter a weight loss and an exercise program.  Marlan Palau MD 06/17/2014 6:52 PM  [facility] [address]  [place], [place] [address]  Phone [phone] Fax [phone]

## 2014-06-17 NOTE — Patient Instructions (Signed)
Pseudotumor Cerebri Pseudotumor cerebri, also called idiopathic intracranial hypertension, is a condition that occurs due to increased pressure within your skull. Although some of the symptoms resemble those of a brain tumor, it is not a brain tumor. Symptoms occur when the increased pressure in your skull compresses brain structures. For example, pressure on the nerve responsible for vision (optic nerve) causes it to swell, resulting in visual symptoms. Pseudotumor cerebri tends to occur in obese women younger than 27 years of age. However, men and children can also develop this condition. SYMPTOMS  Symptoms of pseudotumor cerebri occur due to increased pressure within the skull. Symptoms may include:   Headaches.  Nausea and vomiting.  Dizziness.  High blood pressure.   Ringing in the ears.  Double or blurred vision.  Brief episodes of complete loss of vision.  Pain in the back, neck, or shoulders. DIAGNOSIS  Pseudotumor cerebri is diagnosed through:  A detailed eye exam, which can reveal a swollen optic nerve, as well as identifying issues such as blind spots in the vision.  An MRI or CT scan to rule out other disorders that can cause similar symptoms, such as brain tumors.  A spinal tap (lumbar puncture), which can demonstrate increased pressure within the skull. TREATMENT  There are several ways that pseudotumor cerebri is treated, including:  Medicines to decrease the production of spinal fluid and lower the pressure within your skull.  Medicines to prevent or treat headaches.  Surgery to create an opening in your optic nerve to allow excess fluid to drain out.  Surgery to place drains (shunts) in your brain to remove excess fluid. HOME CARE INSTRUCTIONS   Take all medicines as directed by your health care provider.  Go to all of your follow-up appointments.  Lose weight if you are overweight. SEEK MEDICAL CARE IF:  Any symptoms come back.  You develop  trouble with hearing, vision, balance, or your sense of smell.  You cannot eat or drink what you need.  You are more weak or tired than usual.   You are losing weight without trying. SEEK IMMEDIATE MEDICAL CARE IF:  You have new symptoms such as vision problems or difficulty walking.   You have a seizure.   You have trouble breathing.   You have a fever.  Document Released: 01/07/2013 Document Reviewed: 01/07/2013 ExitCare Patient Information 2015 [employer]. This information is not intended to replace advice given to you by your health care provider. Make sure you discuss any questions you have with your health care provider.  

## 2014-10-19 ENCOUNTER — Ambulatory Visit: Payer: 59 | Admitting: Nurse Practitioner

## 2017-07-09 ENCOUNTER — Emergency Department (HOSPITAL_COMMUNITY): Payer: Medicaid Other

## 2017-07-09 ENCOUNTER — Emergency Department (HOSPITAL_COMMUNITY)
Admission: EM | Admit: 2017-07-09 | Discharge: 2017-07-09 | Disposition: A | Discharge status: Home / Self Care | Payer: Medicaid Other | Attending: Emergency Medicine | Admitting: Emergency Medicine

## 2017-07-09 ENCOUNTER — Encounter (HOSPITAL_COMMUNITY): Payer: Self-pay | Admitting: Emergency Medicine

## 2017-07-09 DIAGNOSIS — Z79899 Other long term (current) drug therapy: Secondary | ICD-10-CM | POA: Diagnosis not present

## 2017-07-09 DIAGNOSIS — I10 Essential (primary) hypertension: Secondary | ICD-10-CM | POA: Insufficient documentation

## 2017-07-09 DIAGNOSIS — Z91010 Allergy to peanuts: Secondary | ICD-10-CM | POA: Insufficient documentation

## 2017-07-09 DIAGNOSIS — Y999 Unspecified external cause status: Secondary | ICD-10-CM | POA: Diagnosis not present

## 2017-07-09 DIAGNOSIS — S62620A Displaced fracture of medial phalanx of right index finger, initial encounter for closed fracture: Secondary | ICD-10-CM | POA: Diagnosis not present

## 2017-07-09 DIAGNOSIS — Y92310 Basketball court as the place of occurrence of the external cause: Secondary | ICD-10-CM | POA: Insufficient documentation

## 2017-07-09 DIAGNOSIS — Y9367 Activity, basketball: Secondary | ICD-10-CM | POA: Insufficient documentation

## 2017-07-09 DIAGNOSIS — W2105XA Struck by basketball, initial encounter: Secondary | ICD-10-CM | POA: Diagnosis not present

## 2017-07-09 DIAGNOSIS — S6991XA Unspecified injury of right wrist, hand and finger(s), initial encounter: Secondary | ICD-10-CM | POA: Diagnosis present

## 2017-07-09 MED ORDER — IBUPROFEN 200 MG PO TABS
600.0000 mg | ORAL_TABLET | Freq: Once | ORAL | Status: AC
  Administered 2017-07-09: 600 mg via ORAL
  Filled 2017-07-09: qty 3

## 2017-07-09 NOTE — Discharge Instructions (Signed)
Either buddy tape your fingers, or use splint, to provide some protection and comfort to the finger. Ice and elevate finger throughout the day, using ice pack for no more than 20 minutes every hour.  Alternate between tylenol and motrin as needed for pain relief. The hand specialist's office will call you tomorrow to arrange a follow up appointment in about 1 week for recheck of symptoms and ongoing management of your finger injury. Return to the ER for changes or worsening symptoms.

## 2017-07-09 NOTE — ED Triage Notes (Signed)
Pt reports that she was playing ball with her nephews yesterday and didn't catch the ball and jammed her right index finger.

## 2017-07-09 NOTE — ED Provider Notes (Signed)
[facility]-EMERGENCY DEPT Provider Note   CSN: [id number] Arrival date & time: 07/09/17  1118     History   Chief Complaint Chief Complaint  Patient presents with  . Hand Pain    HPI Karen Ashley is a 32 y.o. female with a PMHx of HTN, migraines, pseudotumor cerebri, and obesity, who presents to the ED with complaints of right index finger injury that occurred yesterday.  Patient states that she was attempting to catch a basketball when she accidentally jammed her index finger on the ball.  Initially she was able to fully move it, but later on she started having swelling and more difficulty bending her finger, as well as more pain.  She describes the pain as 5/10 constant throbbing nonradiating right index finger pain that worsens with bending the finger and has been unrelieved with ice, no other treatments tried.  She reports associated swelling and bruising.  She denies any cuts or abrasions, numbness, tingling, focal weakness, or other injury sustained during the incident, or any other complaints at this time.  She does not have a hand specialist that she sees.  The history is provided by the patient and medical records. No language interpreter was used.  Hand Pain     Past Medical History:  Diagnosis Date  . Abnormal Pap smear    Had rpt pap;was normal;Last pap 08/2011 was normal  . Chlamydia 2012   Was treated and partner treated  . Hypertension   . Migraines    Typcially takes Excedrin or BC powder  . MRSA (methicillin resistant staph aureus) culture positive    Was rx'd doxycycline x 2;found under arm and inner thigh  . Obese 03/07/2012  . Peanut allergy   . Pseudotumor cerebri 06/17/2014    Patient Active Problem List   Diagnosis Date Noted  . Pseudotumor cerebri 06/17/2014  . Obese 03/07/2012    Past Surgical History:  Procedure Laterality Date  . CESAREAN SECTION N/A 09/28/2012   Procedure: Primary cesarean section with delivery of baby girl at  0907.  Apgars 9/10.;  Surgeon: Geryl Rankins, MD;  Location: [facility];  Service: Obstetrics;  Laterality: N/A;  . TONSILLECTOMY  2007  . WISDOM TOOTH EXTRACTION  2006   All 4 removed     OB History    Gravida  1   Para  1   Term  1   Preterm      AB      Living  1     SAB      TAB      Ectopic      Multiple      Live Births  1            Home Medications    Prior to Admission medications   Medication Sig Start Date End Date Taking? Authorizing Provider  acetaZOLAMIDE (DIAMOX) 125 MG tablet Take 1 tablet (125 mg total) by mouth 2 (two) times daily. 06/08/14   Rolland Porter, MD  amLODipine (NORVASC) 5 MG tablet Take 5 mg by mouth daily.    [provider]  Aspirin-Acetaminophen-Caffeine (GOODYS EXTRA STRENGTH PO) Take 1 packet by mouth daily as needed (headache).    [provider]  HYDROcodone-acetaminophen (NORCO/VICODIN) 5-325 MG per tablet Take 1-2 tablets every 6 hours as needed for severe pain 06/11/14   Renne Crigler, PA-C  triamterene-hydrochlorothiazide (DYAZIDE) 37.5-25 MG per capsule Take 1 capsule by mouth daily.    [provider]  cetirizine (ZYRTEC) 10  MG tablet Take 1 tablet (10 mg total) by mouth daily. Patient not taking: Reported on 06/08/2014 03/25/12 06/11/14  Elane Fritzarter, [place], FNP    Family History Family History  Problem Relation Age of Onset  . Hypertension Mother   . Migraines Mother   . Fibroids Mother   . Hypertension Father   . Anemia Sister        x 2  . Migraines Sister        x 3  . Hypertension Sister        x 2  . Cancer Maternal Grandmother        Deceased  . Diabetes type II Maternal Grandfather   . Diabetes type I Other        Niece    Social History Social History   Tobacco Use  . Smoking status: Never Smoker  . Smokeless tobacco: Never Used  Substance Use Topics  . Alcohol use: Yes    Alcohol/week: 1.8 - 2.4 oz    Types: 3 - 4 Standard drinks or equivalent per week  . Drug use: No     Types: Marijuana    Comment: Stopped 01/22/2012     Allergies   Peanuts [peanut oil]   Review of Systems Review of Systems  Musculoskeletal: Positive for arthralgias and joint swelling.  Skin: Positive for color change. Negative for wound.  Allergic/Immunologic: Negative for immunocompromised state.  Neurological: Negative for weakness and numbness.  Psychiatric/Behavioral: Negative for confusion.     Physical Exam Updated Vital Signs BP (!) 155/111 (BP Location: Left Arm)   Pulse 77   Temp 98.2 F (36.8 C) (Oral)   Resp 16   Ht 5\' 2"  (1.575 m)   Wt 99.8 kg (220 lb)   LMP 05/30/2017   SpO2 100%   BMI 40.24 kg/m   Physical Exam  Constitutional: She is oriented to person, place, and time. Vital signs are normal. She appears well-developed and well-nourished.  Non-toxic appearance. No distress.  Afebrile, nontoxic, NAD  HENT:  Head: Normocephalic and atraumatic.  Mouth/Throat: Mucous membranes are normal.  Eyes: Conjunctivae and EOM are normal. Right eye exhibits no discharge. Left eye exhibits no discharge.  Neck: Normal range of motion. Neck supple.  Cardiovascular: Normal rate and intact distal pulses.  Pulmonary/Chest: Effort normal. No respiratory distress.  Abdominal: Normal appearance. She exhibits no distension.  Musculoskeletal:       Right hand: She exhibits decreased range of motion (of 2nd PIP/DIP joints, due to pain), tenderness, bony tenderness and swelling. She exhibits normal two-point discrimination, normal capillary refill, no deformity and no laceration. Normal sensation noted. Normal strength noted.  R 2nd finger with mildly limited ROM at PIP and DIP joints due to pain, but FROM intact at MCP joint, mild swelling and bruising to finger, mild TTP to PIP joint and into the middle phalanx area, no other areas of TTP to remainder of hand/wrist, no crepitus or deformities, no overlying wounds/abrasions, grip strength still fairly well preserved, sensation  grossly intact, cap refill brisk and present in all fingers and distal pulses intact, soft compartments.   Neurological: She is alert and oriented to person, place, and time. She has normal strength. No sensory deficit.  Skin: Skin is warm, dry and intact. No rash noted.  Psychiatric: She has a normal mood and affect. Her behavior is normal.  Nursing note and vitals reviewed.    ED Treatments / Results  Labs (all labs ordered are listed, but only abnormal results are  displayed) Labs Reviewed - No data to display  EKG None  Radiology Dg Finger Index Right  Result Date: 07/09/2017 CLINICAL DATA:  Right index finger pain after injury playing ball yesterday. EXAM: RIGHT INDEX FINGER 2+V COMPARISON:  None. FINDINGS: Mildly displaced fracture is seen involving the volar aspect of the proximal base of the second middle phalanx. Joint spaces are intact. No soft tissue abnormality is noted. IMPRESSION: Mildly displaced fracture involving volar aspect of proximal base of second middle phalanx. Electronically Signed   By: Lupita Raider, M.D.   On: 07/09/2017 12:32    Procedures Procedures (including critical care time)  Medications Ordered in ED Medications  ibuprofen (ADVIL,MOTRIN) tablet 600 mg (600 mg Oral Given 07/09/17 1403)     Initial Impression / Assessment and Plan / ED Course  I have reviewed the triage vital signs and the nursing notes.  Pertinent labs & imaging results that were available during my care of the patient were reviewed by me and considered in my medical decision making (see chart for details).     32 y.o. female here with R index finger pain after jamming her finger yesterday. On exam, mild bruising and swelling to R 2nd finger, mildly limited ROM at PIP and DIP joints d/t pain but FROM intact at MCP joint, no crepitus/deformity, mild TTP to PIP joint area and into the middle phalanx portion of the finger. No other areas of tenderness to remainder of hand/wrist.  Xray showing mildly displaced fx involving volar aspect of proximal base of 2nd middle phalanx. Will discuss case with Dr. Janee Morn who's on call for hand surgery, anticipate finger splint and f/up. Will give ibuprofen at pt's request. Will reassess shortly.   3:39 PM Several delays in reaching hand specialist due to miscommunications, but just spoke with Dr. Janee Morn who stated she can either be buddy taped or provided with aluminum finger splint for comfort, and his office will call tomorrow to set up appt in 1wk. Will buddy tape, and also provide with splint, discussed use of either one whichever is most comfortable for her. Advised RICE, tylenol/motrin for pain, and f/up with hand specialist. I explained the diagnosis and have given explicit precautions to return to the ER including for any other new or worsening symptoms. The patient understands and accepts the medical plan as it's been dictated and I have answered their questions. Discharge instructions concerning home care and prescriptions have been given. The patient is STABLE and is discharged to home in good condition.    Final Clinical Impressions(s) / ED Diagnoses   Final diagnoses:  Displaced fracture of middle phalanx of right index finger, initial encounter for closed fracture    ED Discharge Orders    [address] [place], [place] 07/09/17 1541    Bethann Berkshire, MD 07/09/17 1549

## 2018-06-01 ENCOUNTER — Emergency Department (HOSPITAL_COMMUNITY)
Admission: EM | Admit: 2018-06-01 | Discharge: 2018-06-01 | Disposition: A | Discharge status: Home / Self Care | Payer: 59 | Attending: Emergency Medicine | Admitting: Emergency Medicine

## 2018-06-01 ENCOUNTER — Other Ambulatory Visit: Payer: Self-pay

## 2018-06-01 ENCOUNTER — Emergency Department (HOSPITAL_COMMUNITY): Payer: 59

## 2018-06-01 ENCOUNTER — Emergency Department (HOSPITAL_BASED_OUTPATIENT_CLINIC_OR_DEPARTMENT_OTHER): Payer: 59

## 2018-06-01 DIAGNOSIS — M79609 Pain in unspecified limb: Secondary | ICD-10-CM | POA: Diagnosis not present

## 2018-06-01 DIAGNOSIS — M79605 Pain in left leg: Secondary | ICD-10-CM | POA: Diagnosis not present

## 2018-06-01 DIAGNOSIS — I10 Essential (primary) hypertension: Secondary | ICD-10-CM | POA: Diagnosis not present

## 2018-06-01 DIAGNOSIS — Z79899 Other long term (current) drug therapy: Secondary | ICD-10-CM | POA: Diagnosis not present

## 2018-06-01 LAB — POC URINE PREG, ED: Preg Test, Ur: NEGATIVE

## 2018-06-01 MED ORDER — NAPROXEN 500 MG PO TABS: 500.0000 mg | ORAL_TABLET | Freq: Two times a day (BID) | ORAL | 0 refills | Status: AC

## 2018-06-01 MED ORDER — NAPROXEN 500 MG PO TABS: 500.0000 mg | ORAL_TABLET | Freq: Two times a day (BID) | ORAL | 0 refills | Status: DC

## 2018-06-01 NOTE — ED Provider Notes (Addendum)
[facility] EMERGENCY DEPARTMENT Provider Note   CSN: [id number] Arrival date & time: 06/01/18  1357  History   Chief Complaint Chief Complaint  Patient presents with   Leg Pain    HPI Karen Ashley is a 33 y.o. female with past medical significant for obesity, pseudotumor cerebri, hypertension who presents for evaluation of leg pain.  Patient states she has had leg pain x5 days pain located to the posterior portion of her left leg.  Pain starts at her left buttocks and radiates down posterior portion of her leg into her calf.  Patient states she has had subjective left calf swelling.  Denies recent injury or trauma.  Patient describes her pain as a "fullness."  Patient denies previous history of PE, DVT, recent mobilization, recent malignancy chest pain, shortness of breath, fever, chills, nausea, vomiting, midline back pain, numbness or tingling in her extremities, decreased range of motion in her extremities, redness, warmth, rashes or lesions, pallor, temperature changes.  She has not taken anything for symptoms.  States she only has pain when she ambulates on her extremity.  She rates her current pain a 2/10.  She is not on anything for pain at this time.  She denies facial droop, headache, unilateral seizures, phonation changes. No recent injury or trauma.  Denies fever, IV drug use, bowel or bladder seizure, history of anxiety, history of chronic steroid use.  States she did have a tele health visit and they told her to come to the ED to R/o DVT as cause of her pain.  History obtained from patient.  No interpreter was used.     HPI  Past Medical History:  Diagnosis Date   Abnormal Pap smear    Had rpt pap;was normal;Last pap 08/2011 was normal   Chlamydia 2012   Was treated and partner treated   Hypertension    Migraines    Typcially takes Excedrin or BC powder   MRSA (methicillin resistant staph aureus) culture positive    Was rx'd doxycycline x 2;found  under arm and inner thigh   Obese 03/07/2012   Peanut allergy    Pseudotumor cerebri 06/17/2014    Patient Active Problem List   Diagnosis Date Noted   Pseudotumor cerebri 06/17/2014   Obese 03/07/2012    Past Surgical History:  Procedure Laterality Date   CESAREAN SECTION N/A 09/28/2012   Procedure: Primary cesarean section with delivery of baby girl at 0907.  Apgars 9/10.;  Surgeon: Geryl Rankins, MD;  Location: [facility];  Service: Obstetrics;  Laterality: N/A;   TONSILLECTOMY  2007   WISDOM TOOTH EXTRACTION  2006   All 4 removed     OB History    Gravida  1   Para  1   Term  1   Preterm      AB      Living  1     SAB      TAB      Ectopic      Multiple      Live Births  1            Home Medications    Prior to Admission medications   Medication Sig Start Date End Date Taking? Authorizing Provider  acetaZOLAMIDE (DIAMOX) 125 MG tablet Take 1 tablet (125 mg total) by mouth 2 (two) times daily. 06/08/14   Rolland Porter, MD  amLODipine (NORVASC) 5 MG tablet Take 5 mg by mouth daily.    [provider]  Aspirin-Acetaminophen-Caffeine (  GOODYS EXTRA STRENGTH PO) Take 1 packet by mouth daily as needed (headache).    [provider]  HYDROcodone-acetaminophen (NORCO/VICODIN) 5-325 MG per tablet Take 1-2 tablets every 6 hours as needed for severe pain 06/11/14   Renne Crigler, PA-C  naproxen (NAPROSYN) 500 MG tablet Take 1 tablet (500 mg total) by mouth 2 (two) times daily. 06/01/18   Dominica Kent A, PA-C  triamterene-hydrochlorothiazide (DYAZIDE) 37.5-25 MG per capsule Take 1 capsule by mouth daily.    [provider]    Family History Family History  Problem Relation Age of Onset   Hypertension Mother    Migraines Mother    Fibroids Mother    Hypertension Father    Anemia Sister        x 2   Migraines Sister        x 3   Hypertension Sister        x 2   Cancer Maternal Grandmother        Deceased    Diabetes type II Maternal Grandfather    Diabetes type I Other        Niece    Social History Social History   Tobacco Use   Smoking status: Never Smoker   Smokeless tobacco: Never Used  Substance Use Topics   Alcohol use: Yes    Alcohol/week: 3.0 - 4.0 standard drinks    Types: 3 - 4 Standard drinks or equivalent per week   Drug use: No    Types: Marijuana    Comment: Stopped 01/22/2012     Allergies   Peanuts [peanut oil]   Review of Systems Review of Systems  Constitutional: Negative.   HENT: Negative.   Respiratory: Negative.   Cardiovascular: Negative.   Gastrointestinal: Negative.   Genitourinary: Negative.   Musculoskeletal: Negative for arthralgias, back pain, gait problem, joint swelling, myalgias, neck pain and neck stiffness.       Left leg pain  Skin: Negative.   Neurological: Negative.   All other systems reviewed and are negative.  Physical Exam Updated Vital Signs BP (!) 147/104 (BP Location: Right Arm)    Pulse 82    Temp 98.3 F (36.8 C) (Oral)    Resp 18    SpO2 99%   Physical Exam  Physical Exam  Constitutional: Pt appears well-developed and well-nourished. No distress.  HENT:  Head: Normocephalic and atraumatic.  Mouth/Throat: Oropharynx is clear and moist. No oropharyngeal exudate.  Eyes: Conjunctivae are normal.  Neck: Normal range of motion. Neck supple.  Full ROM without pain  Cardiovascular: Normal rate, regular rhythm and intact distal pulses.  2+ DP, PT pulses bilaterally.  2+ femoral pulses bilaterally. Pulmonary/Chest: Effort normal and breath sounds normal. No respiratory distress. Pt has no wheezes.  Abdominal: Soft. Pt exhibits no distension. There is no tenderness, rebound or guarding. No abd bruit or pulsatile mass Musculoskeletal:  Full range of motion of the T-spine and L-spine with flexion, hyperextension, and lateral flexion. No midline tenderness or stepoffs. No tenderness to palpation of the spinous processes of the  T-spine or L-spine. No tenderness to palpation of the paraspinous muscles of the L-spine. Negative straight leg raise. Full range of motion to bilateral lower extremities with flexion, extension, abduction and abduction.  She is able to perform straight leg raise bilaterally.  She has no tenderness over pelvis/bilateral hips.  She has no bony tenderness over femur, knees, tibia/fibula.  Negative varus, valgus stress to bilateral knees.  Negative anterior drawer bilaterally. No pain  out of proportion to exam.  BLE compartments soft. Lymphadenopathy:    Pt has no cervical adenopathy.  Neurological: Pt is alert. Pt has normal reflexes.  Reflex Scores:      Bicep reflexes are 2+ on the right side and 2+ on the left side.      Brachioradialis reflexes are 2+ on the right side and 2+ on the left side.      Patellar reflexes are 2+ on the right side and 2+ on the left side.      Achilles reflexes are 2+ on the right side and 2+ on the left side. Speech is clear and goal oriented, follows commands Normal 5/5 strength in upper and lower extremities bilaterally including dorsiflexion and plantar flexion, strong and equal grip strength Sensation normal to light and sharp touch Moves extremities without ataxia, coordination intact Normal gait Normal balance No Clonus Skin: Skin is warm and dry. No rash noted or lesions noted. Pt is not diaphoretic. No erythema, ecchymosis,edema or warmth. No pallor, vesicles, induration or fluctuance. Psychiatric: Pt has a normal mood and affect. Behavior is normal.  Nursing note and vitals reviewed. ED Treatments / Results  Labs (all labs ordered are listed, but only abnormal results are displayed) Labs Reviewed  POC URINE PREG, ED    EKG None  Radiology Vas Koreas Lower Extremity Venous (dvt) (only [facility])  Result Date: 06/01/2018  Lower Venous Study Indications: Pain.  Limitations: Body habitus. Comparison Study: No prior study on file for comparison  Performing Technologist: Sherren Kernsandace Kanady RVS  Examination Guidelines: A complete evaluation includes B-mode imaging, spectral Doppler, color Doppler, and power Doppler as needed of all accessible portions of each vessel. Bilateral testing is considered an integral part of a complete examination. Limited examinations for reoccurring indications may be performed as noted.  +-----+---------------+---------+-----------+----------+-------+  RIGHT Compressibility Phasicity Spontaneity Properties Summary  +-----+---------------+---------+-----------+----------+-------+  CFV   Full            Yes       Yes                             +-----+---------------+---------+-----------+----------+-------+   +---------+---------------+---------+-----------+----------+-------+  LEFT      Compressibility Phasicity Spontaneity Properties Summary  +---------+---------------+---------+-----------+----------+-------+  CFV       Full            Yes       Yes                             +---------+---------------+---------+-----------+----------+-------+  SFJ       Full                                                      +---------+---------------+---------+-----------+----------+-------+  FV Prox   Full                                                      +---------+---------------+---------+-----------+----------+-------+  FV Mid    Full                                                      +---------+---------------+---------+-----------+----------+-------+  FV Distal Full                                                      +---------+---------------+---------+-----------+----------+-------+  PFV       Full                                                      +---------+---------------+---------+-----------+----------+-------+  POP       Full            Yes       Yes                             +---------+---------------+---------+-----------+----------+-------+  PTV       Full                                                       +---------+---------------+---------+-----------+----------+-------+  PERO      Full                                                      +---------+---------------+---------+-----------+----------+-------+     Summary: Right: No evidence of common femoral vein obstruction. Left: There is no evidence of deep vein thrombosis in the lower extremity.  *See table(s) above for measurements and observations.    Preliminary     Procedures Procedures (including critical care time)  Medications Ordered in ED Medications - No data to display   Initial Impression / Assessment and Plan / ED Course  I have reviewed the triage vital signs and the nursing notes.  Pertinent labs & imaging results that were available during my care of the patient were reviewed by me and considered in my medical decision making (see chart for details).  33 year old female appears otherwise well presents for evaluation of left leg pain. She is afebrile, nonseptic, non-ill-appearing. Hearts and lungs clear. Abd soft nontender without rebound or guarding. Left leg pain x5 days.  Pain begins in her left buttocks radiates down her posterior left leg.  Describes her pain as a "fullness."  She feels like she has had some swelling to her left calf.  She has no objective swelling on my exam.  No CP or SOB. She has no edema, erythema, ecchymosis, warmth, rashes or lesions to bilateral lower extremities. Normal temperature of extremities. Specifically no vesicular lesions. She has no tenderness to midline spine.  She has a normal musculoskeletal exam.  She is neurovascularly intact. No unilateral symptoms. Low suspicion for CVA. She has not taken anything for pain at this time. She has no pain out of proportion to exam.  She has 2+ pulses to her femoral as well as DP, PT pulses.  Low suspicion for arterial thrombosis at this time.  She had ultrasound of her left lower extremity which was negative for DVT.  She has no red flag symptoms for back  pain.  Low suspicion for cauda equina, discitis, transverse myelitis, osteomyelitis as cause of her symptoms.  She is able to ambulate in department without difficulty.  Suspect musculoskeletal pain. Low suspicion for arterial, vascular, infectious or neurologic injury cause of her pain.  BLE compartmetns soft, low suspicion for compartment syndrome. Also low suspicion for myositis, septic joint, gout, hemarthrosis as cause of her pain. Will order plain film lumbar spine to rule out spondylolysis as cause of her pain.   If plain film lumbar spine negative we will plan to DC home with naproxen for pain control follow-up with orthopedics.   Care transferred to Ward, PA-C who will follow up on imaging and determine appropriate disposition.      Final Clinical Impressions(s) / ED Diagnoses   Final diagnoses:  Left leg pain    ED Discharge Orders         Ordered    naproxen (NAPROSYN) 500 MG tablet  2 times daily     06/01/18 1624           Ziana Heyliger A, PA-C 06/01/18 1625    Eilyn Polack A, PA-C 06/01/18 1632    Tegeler, Canary Brim, MD 06/02/18 [phone]

## 2018-06-01 NOTE — ED Notes (Signed)
Patient verbalizes understanding of discharge instructions . Opportunity for questions and answers were provided . Armband removed by staff ,Pt discharged from ED. W/C  offered at D/C  and Declined W/C at D/C and was escorted to lobby by RN.  

## 2018-06-01 NOTE — ED Triage Notes (Signed)
Pt c/o left leg pain that began 5 days  Ago; pt denies any trauma to the area ; no swelling noted

## 2018-06-01 NOTE — ED Provider Notes (Signed)
Care assumed from previous provider PA Henderly. Please see note for further details. Case discussed, plan agreed upon. Will follow up on pending L spine x-ray. If negative, anticipate discharge with outpatient pcp or ortho follow up.  X-ray reviewed with no acute findings, but does show mild right facet degenerative changes at L5-S1. Not the side where she is symptomatic. NVI on my exam. No red flag symptoms. Will treat symptomatically and have her follow up with ortho.     Karen Ashley, Chase Picket, PA-C 06/01/18 1730    Alvira Monday, MD 06/03/18 [phone]

## 2018-06-01 NOTE — ED Notes (Signed)
Pt stated didn't have to use bathroom at the moment.

## 2018-06-01 NOTE — Progress Notes (Signed)
VASCULAR LAB PRELIMINARY  PRELIMINARY  PRELIMINARY  PRELIMINARY  Left lower extremity venous duplex completed.    Preliminary report:  See CV proc for preliminary results.  Gave report to [employer], PA-C  Margaurite Salido, RVT 06/01/2018, 3:38 PM

## 2018-06-01 NOTE — Discharge Instructions (Addendum)
It was my pleasure taking care of you today!   Call the orthopedic doctor listed on Monday to schedule a follow up appointment.   Naproxen as needed for pain.   Return to the ED for worsening back pain, fever, weakness or numbness of either leg, or if you develop either (1) an inability to urinate or have bowel movements, or (2) loss of your ability to control your bathroom functions (if you start having "accidents"), or if you develop other new symptoms that concern you.

## 2019-05-29 ENCOUNTER — Ambulatory Visit (INDEPENDENT_AMBULATORY_CARE_PROVIDER_SITE_OTHER): Payer: 59

## 2019-05-29 ENCOUNTER — Other Ambulatory Visit: Payer: Self-pay

## 2019-05-29 ENCOUNTER — Ambulatory Visit: Payer: 59 | Admitting: Podiatry

## 2019-05-29 ENCOUNTER — Other Ambulatory Visit: Payer: Self-pay | Admitting: Podiatry

## 2019-05-29 DIAGNOSIS — M722 Plantar fascial fibromatosis: Secondary | ICD-10-CM

## 2019-05-29 DIAGNOSIS — M216X9 Other acquired deformities of unspecified foot: Secondary | ICD-10-CM | POA: Diagnosis not present

## 2019-05-29 DIAGNOSIS — J329 Chronic sinusitis, unspecified: Secondary | ICD-10-CM | POA: Insufficient documentation

## 2019-05-29 DIAGNOSIS — O169 Unspecified maternal hypertension, unspecified trimester: Secondary | ICD-10-CM | POA: Insufficient documentation

## 2019-05-29 DIAGNOSIS — J309 Allergic rhinitis, unspecified: Secondary | ICD-10-CM | POA: Insufficient documentation

## 2019-05-29 DIAGNOSIS — M79671 Pain in right foot: Secondary | ICD-10-CM

## 2019-05-29 DIAGNOSIS — B9689 Other specified bacterial agents as the cause of diseases classified elsewhere: Secondary | ICD-10-CM | POA: Insufficient documentation

## 2019-05-29 NOTE — Patient Instructions (Signed)

## 2019-06-19 ENCOUNTER — Ambulatory Visit: Payer: 59 | Admitting: Podiatry

## 2019-07-30 NOTE — Progress Notes (Addendum)
  Subjective:  Patient ID: Karen Ashley, female    DOB: 11-14-1985,  MRN: [id number]  Chief Complaint  Patient presents with  . Foot Pain    Right plantar midfoot pain 2-3 year duration worse in the morning and after periods of rest. Pt states tried rolling frozen water bottle which did not help. No known injuries.    34 y.o. female presents with the above complaint. History confirmed with patient.   Objective:  Physical Exam: warm, good capillary refill, no trophic changes or ulcerative lesions, normal DP and PT pulses and normal sensory exam. Right Foot: tenderness to palpation medial calcaneal tuber, no pain with calcaneal squeeze, decreased ankle joint ROM and +Silverskiold test  Radiographs: X-ray of the right foot: no evidence of calcaneal stress fracture and plantar calcaneal spur  Assessment:   1. Plantar fasciitis   2. Equinus deformity of foot      Plan:  Patient was evaluated and treated and all questions answered.  Plantar Fasciitis -XR reviewed with patient -Educated patient on stretching and icing of the affected limb -Injection delivered to the plantar fascia of the right foot. -Plantar fascial brace  Procedure: Injection Tendon/Ligament Consent: Verbal consent obtained. Location: Right plantar fascia at the glabrous junction; medial approach. Skin Prep: Alcohol. Injectate: 1 cc 0.5% marcaine plain, 1 cc dexamethasone phosphate, 0.5 cc kenalog 10. Disposition: Patient tolerated procedure well. Injection site dressed with a band-aid.    No follow-ups on file.

## 2019-10-18 ENCOUNTER — Emergency Department (HOSPITAL_COMMUNITY)
Admission: EM | Admit: 2019-10-18 | Discharge: 2019-10-18 | Disposition: A | Discharge status: Home / Self Care | Payer: 59 | Attending: Emergency Medicine | Admitting: Emergency Medicine

## 2019-10-18 ENCOUNTER — Encounter (HOSPITAL_COMMUNITY): Payer: Self-pay | Admitting: Emergency Medicine

## 2019-10-18 ENCOUNTER — Other Ambulatory Visit: Payer: Self-pay

## 2019-10-18 DIAGNOSIS — R319 Hematuria, unspecified: Secondary | ICD-10-CM | POA: Diagnosis not present

## 2019-10-18 DIAGNOSIS — R1032 Left lower quadrant pain: Secondary | ICD-10-CM | POA: Diagnosis not present

## 2019-10-18 DIAGNOSIS — Z5321 Procedure and treatment not carried out due to patient leaving prior to being seen by health care provider: Secondary | ICD-10-CM | POA: Insufficient documentation

## 2019-10-18 LAB — COMPREHENSIVE METABOLIC PANEL
ALT: 30 U/L (ref 0–44)
AST: 26 U/L (ref 15–41)
Albumin: 3.9 g/dL (ref 3.5–5.0)
Alkaline Phosphatase: 52 U/L (ref 38–126)
Anion gap: 8 (ref 5–15)
BUN: 7 mg/dL (ref 6–20)
CO2: 27 mmol/L (ref 22–32)
Calcium: 8.7 mg/dL — ABNORMAL LOW (ref 8.9–10.3)
Chloride: 103 mmol/L (ref 98–111)
Creatinine, Ser: 0.78 mg/dL (ref 0.44–1.00)
GFR calc Af Amer: >60 mL/min (ref >60)
GFR calc non Af Amer: >60 mL/min (ref >60)
Glucose, Bld: 101 mg/dL — ABNORMAL HIGH (ref 70–99)
Potassium: 3 mmol/L — ABNORMAL LOW (ref 3.5–5.1)
Sodium: 138 mmol/L (ref 135–145)
Total Bilirubin: 0.9 mg/dL (ref 0.3–1.2)
Total Protein: 6.7 g/dL (ref 6.5–8.1)

## 2019-10-18 LAB — CBC
HCT: 36.6 % (ref 36.0–46.0)
Hemoglobin: 12.5 g/dL (ref 12.0–15.0)
MCH: 29 pg (ref 26.0–34.0)
MCHC: 34.2 g/dL (ref 30.0–36.0)
MCV: 84.9 fL (ref 80.0–100.0)
Platelets: 276 10*3/uL (ref 150–400)
RBC: 4.31 MIL/uL (ref 3.87–5.11)
RDW: 13.3 % (ref 11.5–15.5)
WBC: 6.3 10*3/uL (ref 4.0–10.5)
nRBC: 0 % (ref 0.0–0.2)

## 2019-10-18 LAB — I-STAT BETA HCG BLOOD, ED (MC, WL, AP ONLY): I-stat hCG, quantitative: <5 m[IU]/mL (ref <5)

## 2019-10-18 LAB — LIPASE, BLOOD: Lipase: 25 U/L (ref 11–51)

## 2019-10-18 NOTE — ED Triage Notes (Signed)
Patient complaining of left lower abdominal pain. Patient states this started yesterday. Patient states that when she urinates she is having blood in her urine. Patient states it is not time for her menstrual.

## 2020-02-26 ENCOUNTER — Other Ambulatory Visit: Payer: Self-pay

## 2020-02-26 ENCOUNTER — Encounter: Payer: 59 | Attending: Obstetrics and Gynecology | Admitting: Registered"

## 2020-02-26 ENCOUNTER — Encounter: Payer: Self-pay | Admitting: Registered"

## 2020-02-26 DIAGNOSIS — I10 Essential (primary) hypertension: Secondary | ICD-10-CM | POA: Insufficient documentation

## 2020-02-26 NOTE — Patient Instructions (Addendum)
-   Aim to eat about every 3-5 hours.   - Have greek yogurt or instant oatmeal + boiled egg as breakfast option.   - Have morning snack between breakfast and lunch and also an afternoon snack before getting off work.   - Lunch and dinner options look great! Nice and balanced! Keep up the great work.

## 2020-02-26 NOTE — Progress Notes (Signed)
Medical Nutrition Therapy  Appointment Start time:  9:20  Appointment End time:  10:15  Primary concerns today: weight loss  Referral diagnosis: essential hypertension Preferred learning style: no preference indicated Learning readiness: change in progress   NUTRITION ASSESSMENT   Pt arrives stating obesity runs in her family. States some members of her family have had a bariatric surgery to help with weight loss. States she is not financially able to pursue bariatric surgery at this time. Reports she has tried intermittent fasting, low carb, keto, "starving myself", etc to try to lose weight. States she temporarily loses it and then it comes back  States she has high blood pressure. Reports when she takes medications, things are well. States she was trying to wean self off of blood pressure medications and numbers would increase; reports high blood pressure at PCP visit during this time, resulting in referral nutrition. States she is taking meds more consistently now.   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 Reports normal work hours are fluctuate week to week, week 1: Monday, Tuesday, Saturday, Sunday and week 2: Tuesday, Wednesday; 12 hour days. States now she is working mandatory overtime 16 hrs/day and sometimes has to go to work on "off days". States she has been working there since 06/2019; under contract until 2023. States she sleeps most of the time when she is off work and sometimes too tired when getting off work at night to eat.   States within the last week she has begun taking her lunch to work and able to work out during her lunch break sometimes. States she likes to workout and go to gym. States it is a stress reliever. States she drinks water to help her cravings at times while at work and at night.   Has one daughter (age 57).    Clinical Medical Hx: essential hypertension Medications: see list Labs: see list Notable Signs/Symptoms: see list  Lifestyle &  Dietary Hx  Estimated daily fluid intake: 32-128 oz Supplements: none Sleep: averages 5-7 hrs/night, scrolling social nets keeps her up at night sometimes Stress / self-care: gym Current average weekly physical activity: cardio 2-3x/week, 30 min.  Allergies: peanuts and peanut butter; can eat tree nuts 24-Hr Dietary Recall First Meal: skips; drinks water will help her feel full Snack: honey bun + chips from snack machine Second Meal (1-2 pm): fish sandwich + fries + salad (sometimes) or fried chicken + fries or lasagna; now having grilled chicken + brown rice + black beans + fruit Snack: drinks water, does not eat Third Meal (7 pm): sometimes skips; mashed potatoes + green beans + baked chicken or [place] and cheese sandwich (with a lot of mayo, mustard) + chips Snack:  Beverages: water   NUTRITION DIAGNOSIS  NB-1.1 Food and nutrition-related knowledge deficit As related to hypertension.  As evidenced by pt verbalized incomplete knowledge.   NUTRITION INTERVENTION  Nutrition education (E-1) on the following topics: Nutrition education and counseling. Pt was educated and counseled on hypertension, nutritional ways to lower blood pressure, ways to increase fiber, vitamin, and mineral intake, and ways to increase physical activity. Discussed metabolism and how to balance meals. Pt was in agreement with goals listed.  Handouts Provided Include   Hypertension Nutrition Therapy  Learning Style & Readiness for Change Teaching method utilized: Visual & Auditory  Demonstrated degree of understanding via: Teach Back  Barriers to learning/adherence to lifestyle change: work-life balance  Goals Established by Pt  Aim to eat about every 3-5  hours.   Have greek yogurt or instant oatmeal + boiled egg as breakfast option.   Have morning snack between breakfast and lunch and also an afternoon snack before getting off work.   Lunch and dinner options look great! Nice and balanced! Keep up the  great work.    MONITORING & EVALUATION Dietary intake, weekly physical activity in 1 month.

## 2020-03-30 ENCOUNTER — Ambulatory Visit: Payer: 59 | Admitting: Registered"

## 2020-05-10 IMAGING — DX LUMBAR SPINE - COMPLETE 4+ VIEW
5 series · 5 of 5 positions shown · non-contrast
Comparison: None.

CLINICAL DATA: Six day history of LEFT LOWER extremity pain,
possibly radiculopathy, associated with pain in the LEFT buttock and
intermittent LEFT LOWER extremity numbness and paresthesias.

EXAM:
LUMBAR SPINE - COMPLETE 4+ VIEW

[l-spine ap]
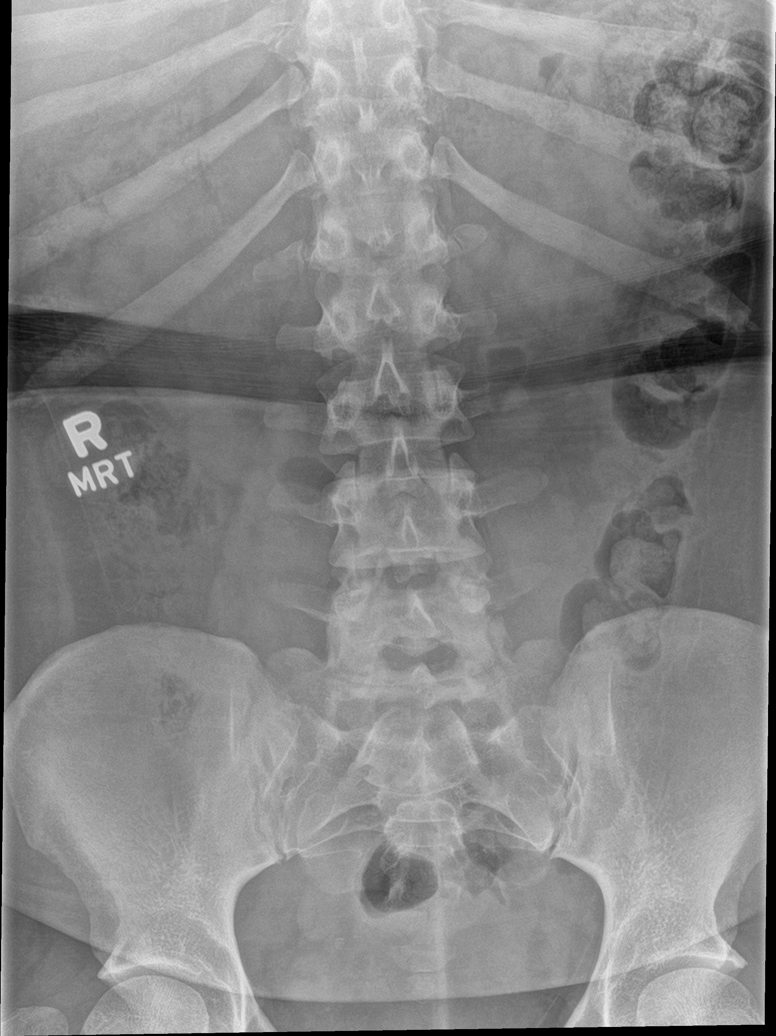

[l-spine obl (1 of 2)]
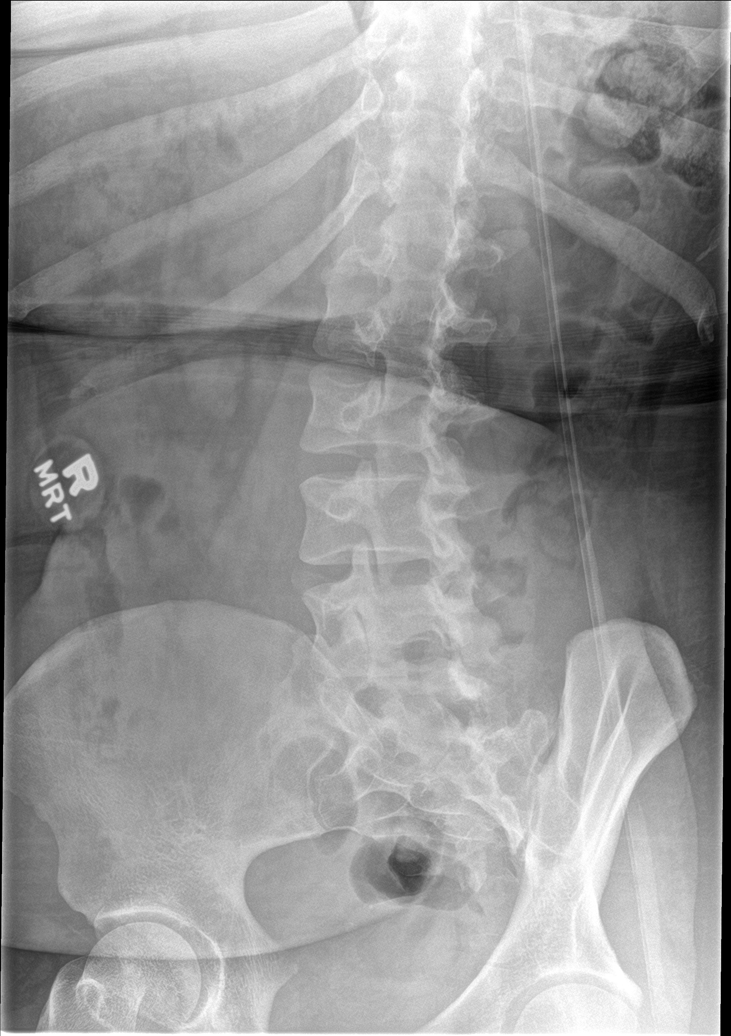

[l-spine obl (2 of 2)]
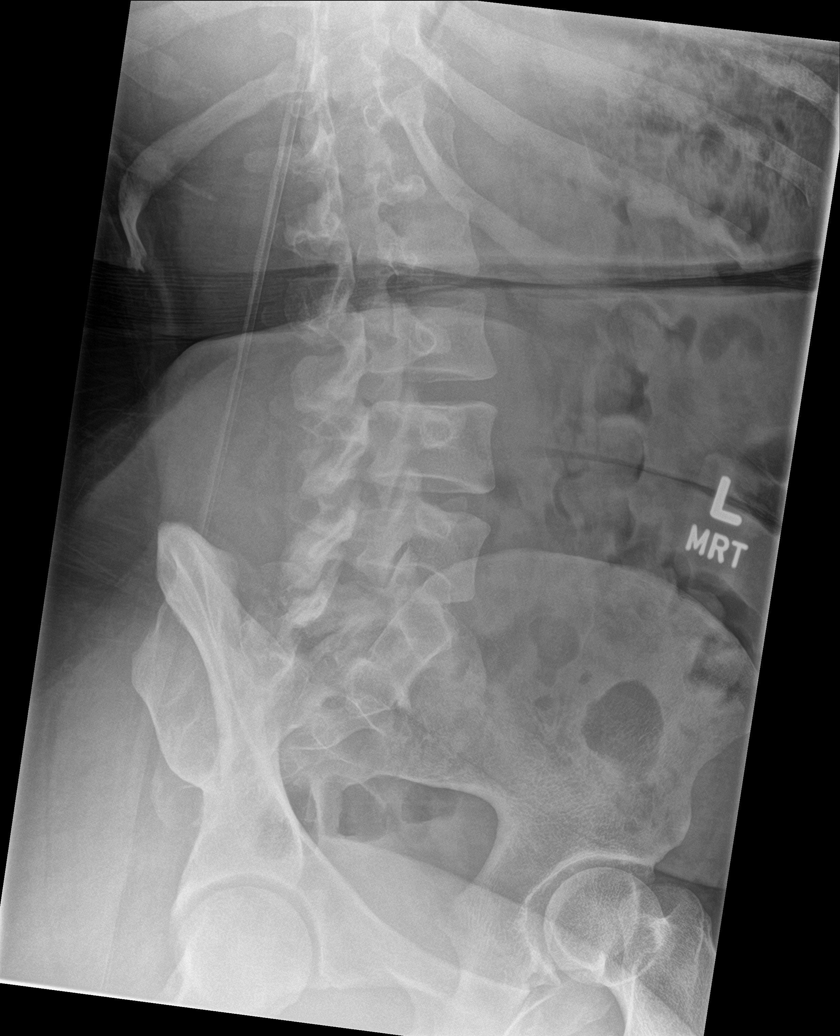

[l-spine lat]
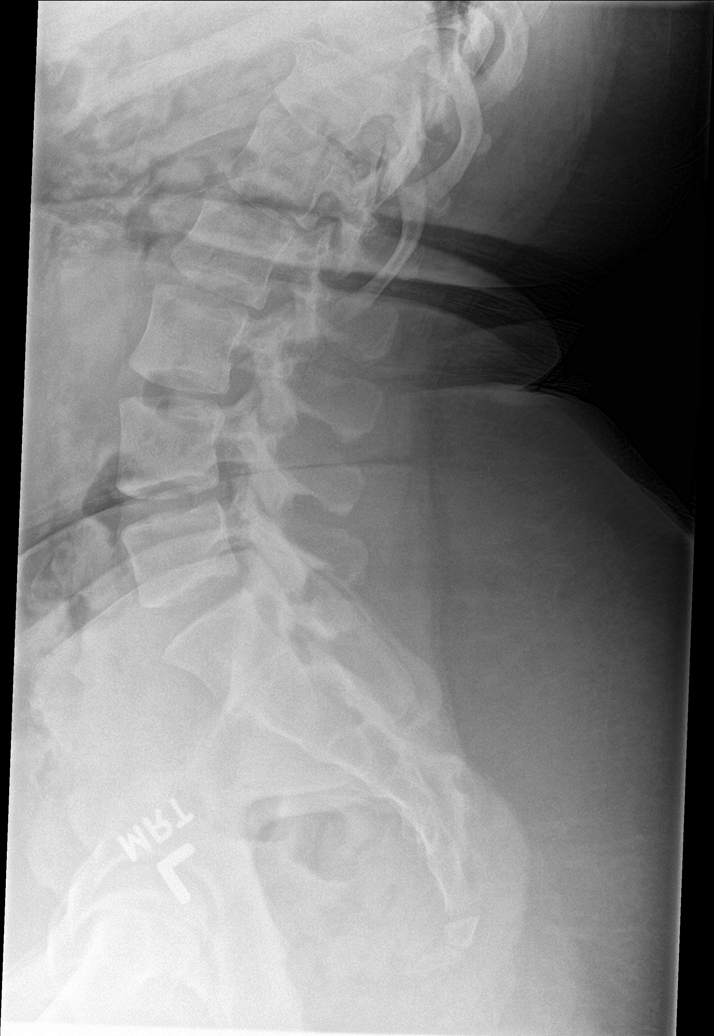

[l-spine spot]
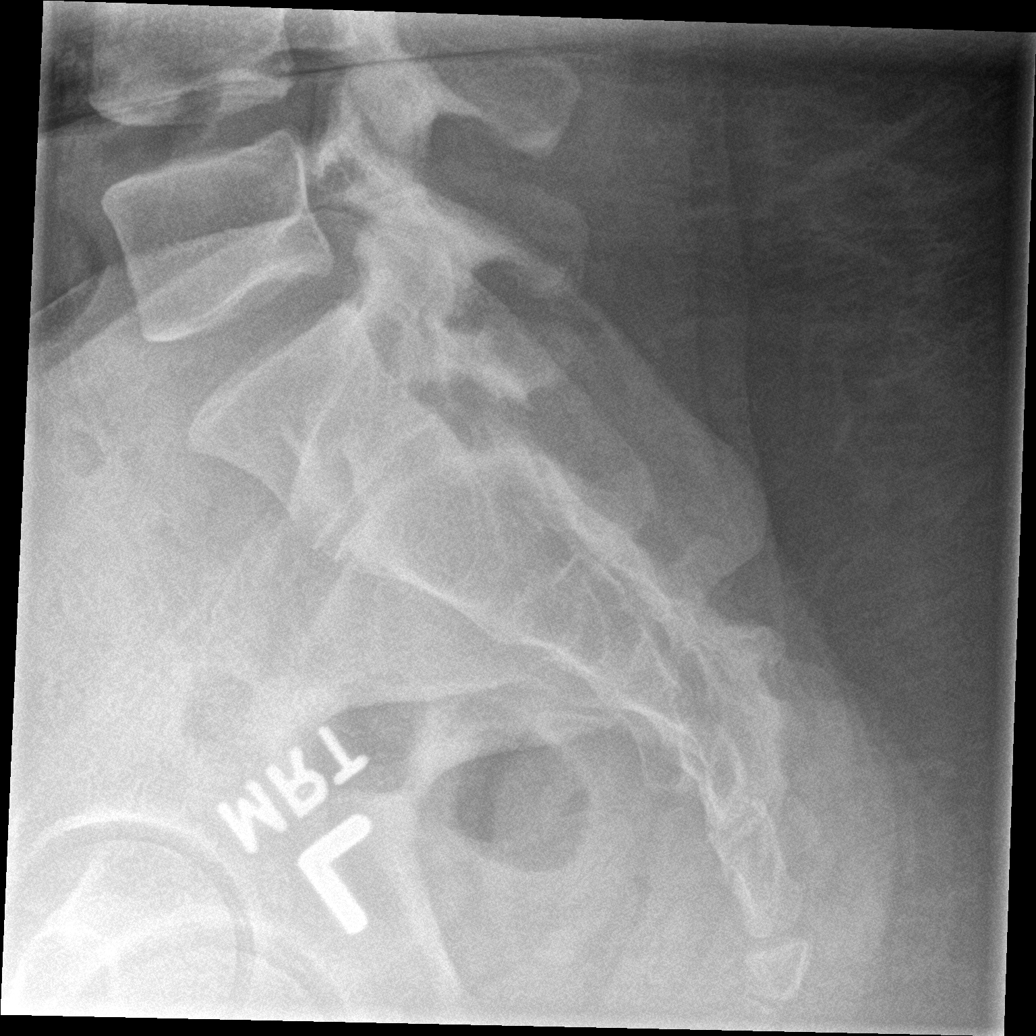

[5 of 5 positions shown; findings below may reference images not displayed]

FINDINGS: Five non-rib-bearing lumbar vertebrae with anatomic alignment.
Well-defined disc space between S1 and S2. No fractures. Mild facet
degenerative changes at L5-S1 on the RIGHT. Well-preserved disc
spaces. No pars defects. Sacroiliac joints anatomically aligned
without significant degenerative change.
IMPRESSION: Mild RIGHT facet degenerative changes at L5-S1. Otherwise normal
examination.

## 2021-02-21 ENCOUNTER — Other Ambulatory Visit: Payer: Self-pay | Admitting: Obstetrics and Gynecology

## 2021-04-14 ENCOUNTER — Encounter (HOSPITAL_BASED_OUTPATIENT_CLINIC_OR_DEPARTMENT_OTHER): Payer: Self-pay

## 2021-04-14 ENCOUNTER — Ambulatory Visit (HOSPITAL_BASED_OUTPATIENT_CLINIC_OR_DEPARTMENT_OTHER): Admit: 2021-04-14 | Payer: 59 | Admitting: Obstetrics and Gynecology

## 2021-04-14 SURGERY — DILATATION & CURETTAGE/HYSTEROSCOPY WITH NOVASURE ABLATION
Anesthesia: Choice

## 2021-12-04 ENCOUNTER — Emergency Department (HOSPITAL_COMMUNITY): Payer: 59

## 2021-12-04 ENCOUNTER — Emergency Department (HOSPITAL_COMMUNITY)
Admission: EM | Admit: 2021-12-04 | Discharge: 2021-12-04 | Disposition: A | Discharge status: Home / Self Care | Payer: 59 | Attending: Emergency Medicine | Admitting: Emergency Medicine

## 2021-12-04 ENCOUNTER — Other Ambulatory Visit: Payer: Self-pay

## 2021-12-04 ENCOUNTER — Encounter (HOSPITAL_COMMUNITY): Payer: Self-pay

## 2021-12-04 DIAGNOSIS — E876 Hypokalemia: Secondary | ICD-10-CM

## 2021-12-04 DIAGNOSIS — Z79899 Other long term (current) drug therapy: Secondary | ICD-10-CM | POA: Insufficient documentation

## 2021-12-04 DIAGNOSIS — N390 Urinary tract infection, site not specified: Secondary | ICD-10-CM | POA: Insufficient documentation

## 2021-12-04 DIAGNOSIS — Z91010 Allergy to peanuts: Secondary | ICD-10-CM | POA: Insufficient documentation

## 2021-12-04 DIAGNOSIS — Z7982 Long term (current) use of aspirin: Secondary | ICD-10-CM | POA: Diagnosis not present

## 2021-12-04 DIAGNOSIS — R109 Unspecified abdominal pain: Secondary | ICD-10-CM | POA: Diagnosis present

## 2021-12-04 LAB — CBC
HCT: 38.1 % (ref 36.0–46.0)
Hemoglobin: 12.5 g/dL (ref 12.0–15.0)
MCH: 28.4 pg (ref 26.0–34.0)
MCHC: 32.8 g/dL (ref 30.0–36.0)
MCV: 86.6 fL (ref 80.0–100.0)
Platelets: 332 10*3/uL (ref 150–400)
RBC: 4.4 MIL/uL (ref 3.87–5.11)
RDW: 13.6 % (ref 11.5–15.5)
WBC: 7.3 10*3/uL (ref 4.0–10.5)
nRBC: 0 % (ref 0.0–0.2)

## 2021-12-04 LAB — URINALYSIS, ROUTINE W REFLEX MICROSCOPIC
Bilirubin Urine: NEGATIVE
Glucose, UA: NEGATIVE mg/dL
Ketones, ur: NEGATIVE mg/dL
Nitrite: POSITIVE — AB
Protein, ur: NEGATIVE mg/dL
Specific Gravity, Urine: 1.013 (ref 1.005–1.030)
WBC, UA: >50 WBC/hpf — ABNORMAL HIGH (ref 0–5)
pH: 8 (ref 5.0–8.0)

## 2021-12-04 LAB — COMPREHENSIVE METABOLIC PANEL
ALT: 14 U/L (ref 0–44)
AST: 15 U/L (ref 15–41)
Albumin: 3.9 g/dL (ref 3.5–5.0)
Alkaline Phosphatase: 49 U/L (ref 38–126)
Anion gap: 5 (ref 5–15)
BUN: 11 mg/dL (ref 6–20)
CO2: 27 mmol/L (ref 22–32)
Calcium: 8.9 mg/dL (ref 8.9–10.3)
Chloride: 101 mmol/L (ref 98–111)
Creatinine, Ser: 0.74 mg/dL (ref 0.44–1.00)
GFR, Estimated: >60 mL/min (ref >60)
Glucose, Bld: 96 mg/dL (ref 70–99)
Potassium: 3.3 mmol/L — ABNORMAL LOW (ref 3.5–5.1)
Sodium: 133 mmol/L — ABNORMAL LOW (ref 135–145)
Total Bilirubin: 0.7 mg/dL (ref 0.3–1.2)
Total Protein: 7.4 g/dL (ref 6.5–8.1)

## 2021-12-04 LAB — PREGNANCY, URINE: Preg Test, Ur: NEGATIVE

## 2021-12-04 MED ORDER — KETOROLAC TROMETHAMINE 15 MG/ML IJ SOLN
15.0000 mg | Freq: Once | INTRAMUSCULAR | Status: AC
  Administered 2021-12-04: 15 mg via INTRAVENOUS
  Filled 2021-12-04: qty 1

## 2021-12-04 MED ORDER — SODIUM CHLORIDE 0.9 % IV BOLUS
1000.0000 mL | Freq: Once | INTRAVENOUS | Status: AC
  Administered 2021-12-04: 1000 mL via INTRAVENOUS

## 2021-12-04 MED ORDER — ONDANSETRON HCL 4 MG/2ML IJ SOLN
4.0000 mg | Freq: Once | INTRAMUSCULAR | Status: AC
  Administered 2021-12-04: 4 mg via INTRAVENOUS
  Filled 2021-12-04: qty 2

## 2021-12-04 MED ORDER — CEPHALEXIN 500 MG PO CAPS: 500.0000 mg | ORAL_CAPSULE | Freq: Four times a day (QID) | ORAL | 0 refills | Status: AC

## 2021-12-04 MED ORDER — PHENAZOPYRIDINE HCL 200 MG PO TABS: 200.0000 mg | ORAL_TABLET | Freq: Three times a day (TID) | ORAL | 0 refills | Status: AC | PRN

## 2021-12-04 MED ORDER — PHENAZOPYRIDINE HCL 200 MG PO TABS
200.0000 mg | ORAL_TABLET | Freq: Once | ORAL | Status: AC
  Administered 2021-12-04: 200 mg via ORAL
  Filled 2021-12-04: qty 1

## 2021-12-04 MED ORDER — SODIUM CHLORIDE 0.9 % IV SOLN
1.0000 g | Freq: Once | INTRAVENOUS | Status: AC
  Administered 2021-12-04: 1 g via INTRAVENOUS
  Filled 2021-12-04: qty 10

## 2021-12-04 MED ORDER — HYDROMORPHONE HCL 1 MG/ML IJ SOLN
0.5000 mg | Freq: Once | INTRAMUSCULAR | Status: AC
  Administered 2021-12-04: 0.5 mg via INTRAVENOUS
  Filled 2021-12-04: qty 1

## 2021-12-04 MED ORDER — POTASSIUM CHLORIDE CRYS ER 20 MEQ PO TBCR
40.0000 meq | EXTENDED_RELEASE_TABLET | Freq: Once | ORAL | Status: AC
  Administered 2021-12-04: 40 meq via ORAL
  Filled 2021-12-04: qty 2

## 2021-12-04 MED ORDER — IOHEXOL 300 MG/ML  SOLN
100.0000 mL | Freq: Once | INTRAMUSCULAR | Status: AC | PRN
  Administered 2021-12-04: 100 mL via INTRAVENOUS

## 2021-12-04 MED ORDER — AZITHROMYCIN 250 MG PO TABS
1000.0000 mg | ORAL_TABLET | Freq: Once | ORAL | Status: AC
  Administered 2021-12-04: 1000 mg via ORAL
  Filled 2021-12-04: qty 4

## 2021-12-04 NOTE — Discharge Instructions (Addendum)
It was our pleasure to provide your ER care today - we hope that you feel better.  Drink plenty of fluids/stay well hydrated.   Take keflex (antibiotic) as prescribed. You may take pyridium as need for bladder pain/spasm. You may also take acetaminophen or ibuprofen as need.  Your potassium level is mildly low - eat plenty of fruits and vegetables, and follow up with your doctor.   Follow up with primary care doctor in one week if symptoms fail to improve/resolve.  Return to ER if worse, new symptoms, high fevers, worsening or severe pain, persistent vomiting, or other concern.  You were given pain meds - no driving for the next 6 hours.

## 2021-12-04 NOTE — ED Triage Notes (Signed)
C/o urinary frequency and burning with lower abd pressure.  Treated with Cipro and pyridium for UTI on 10/27 with no relief. Denies bodyaches, fever, chills.

## 2021-12-04 NOTE — ED Provider Notes (Signed)
[facility] Provider Note   CSN: [id number] Arrival date & time: 12/04/21  4854     History  Chief Complaint  Patient presents with   Urinary Frequency   Abdominal Pain    Karen Ashley is a 36 y.o. female.  Pt c/o lower abdominal pain in the past few weeks. Symptoms constant, dull, moderate/severe, non radiating. +urinary urgency/dysuria. Was treated with cipro recently w minimal improvement. No back/flank pain. No fever or chills. Having normal bms. No vomiting. Notes hx fibroids and has tried to see her ob/gyn for current symptoms but told no appts until January. No new vaginal discharge or bleeding.   The history is provided by the patient and medical records.  Urinary Frequency Associated symptoms include abdominal pain. Pertinent negatives include no chest pain, no headaches and no shortness of breath.  Abdominal Pain Associated symptoms: dysuria   Associated symptoms: no chest pain, no chills, no constipation, no cough, no diarrhea, no fever, no shortness of breath, no sore throat and no vomiting        Home Medications Prior to Admission medications   Medication Sig Start Date End Date Taking? Authorizing Provider  acetaZOLAMIDE (DIAMOX) 125 MG tablet Take 1 tablet (125 mg total) by mouth 2 (two) times daily. Patient not taking: Reported on 02/26/2020 06/08/14   Rolland Porter, MD  amLODipine (NORVASC) 5 MG tablet Take 5 mg by mouth daily. Patient not taking: Reported on 02/26/2020    [provider]  Aspirin-Acetaminophen-Caffeine (GOODYS EXTRA STRENGTH PO) Take 1 packet by mouth daily as needed (headache).    [provider]  atenolol-chlorthalidone (TENORETIC) 50-25 MG tablet TAKE 1 TABLET BY MOUTH ONCE DAILY FOR 30 DAYS 04/29/19   [provider]  azithromycin (ZITHROMAX) 250 MG tablet Take 250 mg by mouth as directed. Patient not taking: Reported on 02/26/2020 02/04/19   [provider]  cetirizine  (ZYRTEC) 10 MG tablet cetirizine 10 mg tablet  TAKE 1 TABLET (10 MG TOTAL) BY MOUTH DAILY.    [provider]  ciprofloxacin (CIPRO) 500 MG tablet SMARTSIG:1 Tablet(s) By Mouth Every 12 Hours Patient not taking: Reported on 02/26/2020 02/24/19   [provider]  doxycycline (VIBRAMYCIN) 100 MG capsule doxycycline hyclate 100 mg capsule  TAKE ONE CAPSULE BY MOUTH ONCE A DAY Patient not taking: Reported on 02/26/2020    [provider]  etonogestrel (NEXPLANON) 68 MG IMPL implant Nexplanon 68 mg subdermal implant Patient not taking: Reported on 02/26/2020    [provider]  fluticasone (FLONASE) 50 MCG/ACT nasal spray fluticasone propionate 50 mcg/actuation nasal spray,suspension  INHALE 1 SPRAY(S) TWICE A DAY BY INTRANASAL ROUTE FOR 30 DAYS.    [provider]  HYDROcodone-acetaminophen (NORCO/VICODIN) 5-325 MG per tablet Take 1-2 tablets every 6 hours as needed for severe pain Patient not taking: Reported on 02/26/2020 06/11/14   Renne Crigler, PA-C  mupirocin ointment (BACTROBAN) 2 % mupirocin 2 % topical ointment  1 APPLICATION TO AFFECTED AREA SMALL AMOUNT TWICE A DAY EXTERNALLY Patient not taking: Reported on 02/26/2020    [provider]  naproxen (NAPROSYN) 500 MG tablet Take 1 tablet (500 mg total) by mouth 2 (two) times daily. Patient not taking: Reported on 02/26/2020 06/01/18   Ward, Chase Picket, PA-C  norethindrone (MICRONOR) 0.35 MG tablet Take 1 tablet by mouth daily. Patient not taking: Reported on 02/26/2020 05/13/19   [provider]  phenazopyridine (PYRIDIUM) 200 MG tablet Take 200 mg by mouth 3 (three) times daily.  Patient not taking: Reported on 02/26/2020 02/24/19   [provider]  triamterene-hydrochlorothiazide (DYAZIDE) 37.5-25 MG per capsule Take 1 capsule by mouth daily. Patient not taking: Reported on 02/26/2020    [provider]  VENTOLIN HFA 108 [phone](90 Base) MCG/ACT inhaler  02/05/19   [provider]      Allergies    Other, Peanut-containing drug products, and Peanuts [peanut oil]    Review of Systems   Review of Systems  Constitutional:  Negative for chills and fever.  HENT:  Negative for sore throat.   Eyes:  Negative for redness.  Respiratory:  Negative for cough and shortness of breath.   Cardiovascular:  Negative for chest pain.  Gastrointestinal:  Positive for abdominal pain. Negative for constipation, diarrhea and vomiting.  Genitourinary:  Positive for dysuria. Negative for flank pain.  Musculoskeletal:  Negative for back pain.  Skin:  Negative for rash.  Neurological:  Negative for headaches.  Hematological:  Does not bruise/bleed easily.  Psychiatric/Behavioral:  Negative for confusion.     Physical Exam Updated Vital Signs BP (!) 156/109   Pulse 71   Temp 98.1 F (36.7 C)   Resp 20   Ht 1.575 m (5\' 2" )   Wt 108 kg   LMP 11/20/2021   SpO2 100%   BMI 43.55 kg/m  Physical Exam Vitals and nursing note reviewed.  Constitutional:      Appearance: Normal appearance. She is well-developed.  HENT:     Head: Atraumatic.     Nose: Nose normal.     Mouth/Throat:     Mouth: Mucous membranes are moist.  Eyes:     General: No scleral icterus.    Conjunctiva/sclera: Conjunctivae normal.  Neck:     Trachea: No tracheal deviation.  Cardiovascular:     Rate and Rhythm: Normal rate and regular rhythm.     Pulses: Normal pulses.     Heart sounds: Normal heart sounds. No murmur heard.    No friction rub. No gallop.  Pulmonary:     Effort: Pulmonary effort is normal. No respiratory distress.     Breath sounds: Normal breath sounds.  Abdominal:     General: Bowel sounds are normal. There is no distension.     Palpations: Abdomen is soft. There is no mass.     Tenderness: There is abdominal tenderness. There is no guarding or rebound.     Hernia: No hernia is present.     Comments: Lower abd tenderness/generalized/bilateral.   Genitourinary:     Comments: No cva tenderness.  Musculoskeletal:        General: No swelling.     Cervical back: Normal range of motion and neck supple. No rigidity. No muscular tenderness.  Skin:    General: Skin is warm and dry.     Findings: No rash.  Neurological:     Mental Status: She is alert.     Comments: Alert, speech normal.   Psychiatric:        Mood and Affect: Mood normal.     ED Results / Procedures / Treatments   Labs (all labs ordered are listed, but only abnormal results are displayed) Results for orders placed or performed during the hospital encounter of 12/04/21  CBC  Result Value Ref Range   WBC 7.3 4.0 - 10.5 K/uL   RBC 4.40 3.87 - 5.11 MIL/uL   Hemoglobin 12.5 12.0 - 15.0 g/dL   HCT 10.938.1 60.436.0 - 54.046.0 %   MCV 86.6 80.0 -  100.0 fL   MCH 28.4 26.0 - 34.0 pg   MCHC 32.8 30.0 - 36.0 g/dL   RDW 20.2 54.2 - 70.6 %   Platelets 332 150 - 400 K/uL   nRBC 0.0 0.0 - 0.2 %  Comprehensive metabolic panel  Result Value Ref Range   Sodium 133 (L) 135 - 145 mmol/L   Potassium 3.3 (L) 3.5 - 5.1 mmol/L   Chloride 101 98 - 111 mmol/L   CO2 27 22 - 32 mmol/L   Glucose, Bld 96 70 - 99 mg/dL   BUN 11 6 - 20 mg/dL   Creatinine, Ser 2.37 0.44 - 1.00 mg/dL   Calcium 8.9 8.9 - 62.8 mg/dL   Total Protein 7.4 6.5 - 8.1 g/dL   Albumin 3.9 3.5 - 5.0 g/dL   AST 15 15 - 41 U/L   ALT 14 0 - 44 U/L   Alkaline Phosphatase 49 38 - 126 U/L   Total Bilirubin 0.7 0.3 - 1.2 mg/dL   GFR, Estimated >31 >51 mL/min   Anion gap 5 5 - 15  Urinalysis, Routine w reflex microscopic Urine, Clean Catch  Result Value Ref Range   Color, Urine AMBER (A) YELLOW   APPearance CLEAR CLEAR   Specific Gravity, Urine 1.013 1.005 - 1.030   pH 8.0 5.0 - 8.0   Glucose, UA NEGATIVE NEGATIVE mg/dL   Hgb urine dipstick SMALL (A) NEGATIVE   Bilirubin Urine NEGATIVE NEGATIVE   Ketones, ur NEGATIVE NEGATIVE mg/dL   Protein, ur NEGATIVE NEGATIVE mg/dL   Nitrite POSITIVE (A) NEGATIVE   Leukocytes,Ua MODERATE (A) NEGATIVE    RBC / HPF 11-20 0 - 5 RBC/hpf   WBC, UA >50 (H) 0 - 5 WBC/hpf   Bacteria, UA RARE (A) NONE SEEN   Squamous Epithelial / LPF 0-5 0 - 5   Mucus PRESENT   Pregnancy, urine  Result Value Ref Range   Preg Test, Ur NEGATIVE NEGATIVE      EKG None  Radiology CT Abdomen Pelvis W Contrast  Result Date: 12/04/2021 CLINICAL DATA:  Urinary tract infection for 2 weeks. Pelvic pressure. EXAM: CT ABDOMEN AND PELVIS WITH CONTRAST TECHNIQUE: Multidetector CT imaging of the abdomen and pelvis was performed using the standard protocol following bolus administration of intravenous contrast. RADIATION DOSE REDUCTION: This exam was performed according to the departmental dose-optimization program which includes automated exposure control, adjustment of the mA and/or kV according to patient size and/or use of iterative reconstruction technique. CONTRAST:  OMNIPAQUE IOHEXOL 300 MG/ML  SOLN COMPARISON:  None Available. FINDINGS: Lower chest: Lung bases are clear. Hepatobiliary: No focal hepatic lesion. Normal gallbladder 1. No acute .  No biliary duct dilatation. Common bile duct is normal. Pancreas: Pancreas is normal. No ductal dilatation. No pancreatic inflammation. Spleen: Normal spleen Adrenals/urinary tract: Adrenal glands and kidneys are normal. The ureters and bladder normal. Stomach/Bowel: Stomach, small bowel, appendix, and cecum are normal. The colon and rectosigmoid colon are normal. Vascular/Lymphatic: Abdominal aorta is normal caliber. No periportal or retroperitoneal adenopathy. No pelvic adenopathy. Reproductive: Uterus and adnexa unremarkable. Other: No free fluid. Musculoskeletal: No aggressive osseous lesion. IMPRESSION: 1. No acute findings abdomen pelvis. 2. Normal gallbladder and appendix. 3. Normal kidneys and bladder. Electronically Signed   By: Genevive Bi M.D.   On: 12/04/2021 10:23    Procedures Procedures    Medications Ordered in ED Medications  sodium chloride 0.9 %  bolus 1,000 mL (has no administration in time range)  HYDROmorphone (DILAUDID) injection 0.5 mg (  has no administration in time range)  ketorolac (TORADOL) 15 MG/ML injection 15 mg (has no administration in time range)  ondansetron (ZOFRAN) injection 4 mg (has no administration in time range)    ED Course/ Medical Decision Making/ A&P                           Medical Decision Making Problems Addressed: Hypokalemia: acute illness or injury Lower urinary tract infectious disease: acute illness or injury with systemic symptoms that poses a threat to life or bodily functions  Amount and/or Complexity of Data Reviewed External Data Reviewed: notes. Labs: ordered. Decision-making details documented in ED Course. Radiology: ordered and independent interpretation performed. Decision-making details documented in ED Course.  Risk Prescription drug management. Parenteral controlled substances.   Iv ns. Continuous pulse ox and cardiac monitoring. Labs ordered/sent. Imaging ordered.   Reviewed nursing notes and prior charts for additional history. External reports reviewed.   Cardiac monitor: sinus rhythm, rate 70.  Labs reviewed/interpreted by me - wbc normal.   Pt indicates not able to see her gyn for some time, indicates no recent imaging/would like imaging done.   CT reviewed/interpreted by me - no acute infection.  Additional labs reviewed/interpreted by me - ua c/w uti, cx sent. Rocephin iv.   Abd soft nt. Tolerating po. Kcl po.  Pt currently appears stable for d/c.   Rec pcp f/u.  Return precautions provided.            Final Clinical Impression(s) / ED Diagnoses Final diagnoses:  None    Rx / DC Orders ED Discharge Orders     None         Cathren Laine, MD 12/04/21 1129

## 2021-12-06 LAB — URINE CULTURE: Culture: >=100000 — AB

## 2021-12-07 ENCOUNTER — Telehealth (HOSPITAL_BASED_OUTPATIENT_CLINIC_OR_DEPARTMENT_OTHER): Payer: Self-pay | Admitting: Emergency Medicine

## 2021-12-07 NOTE — Telephone Encounter (Signed)
Post ED Visit - Positive Culture Follow-up  Culture report reviewed by antimicrobial stewardship pharmacist: [facility] []  , Pharm.D. []  Enzo Bi, Pharm.D., BCPS AQ-ID []  , Pharm.D., BCPS [x]  Celedonio Miyamoto, Pharm.D., BCPS []  [place], Garvin Fila.D., BCPS, AAHIVP []  , Pharm.D., BCPS, AAHIVP []  Georgina Pillion, PharmD, BCPS []  , PharmD, BCPS []  [place], PharmD, BCPS []  [address], PharmD []  , PharmD, BCPS []  Estella Husk, PharmD  Pharmacy Team []  Lysle Pearl, PharmD []  , PharmD []  Phillips Climes, PharmD []  , Rph []  Agapito Games) , PharmD []  Verlan Friends, PharmD []  , PharmD []  Mervyn Gay, PharmD []  , PharmD []  Vinnie Level, PharmD []  [facility], PharmD []  , PharmD []  Len Childs, PharmD   Positive urine culture Treated with cephalexin, organism sensitive to the same and no further patient follow-up is required at this time.  12/07/2021, 12:12 PM

## 2022-01-14 ENCOUNTER — Emergency Department (HOSPITAL_COMMUNITY)
Admission: EM | Admit: 2022-01-14 | Discharge: 2022-01-14 | Disposition: A | Discharge status: Home / Self Care | Payer: 59 | Attending: Emergency Medicine | Admitting: Emergency Medicine

## 2022-01-14 ENCOUNTER — Other Ambulatory Visit: Payer: Self-pay

## 2022-01-14 ENCOUNTER — Encounter (HOSPITAL_COMMUNITY): Payer: Self-pay

## 2022-01-14 DIAGNOSIS — J101 Influenza due to other identified influenza virus with other respiratory manifestations: Secondary | ICD-10-CM | POA: Insufficient documentation

## 2022-01-14 DIAGNOSIS — Z20822 Contact with and (suspected) exposure to covid-19: Secondary | ICD-10-CM | POA: Insufficient documentation

## 2022-01-14 DIAGNOSIS — Z91010 Allergy to peanuts: Secondary | ICD-10-CM | POA: Diagnosis not present

## 2022-01-14 DIAGNOSIS — R509 Fever, unspecified: Secondary | ICD-10-CM | POA: Diagnosis present

## 2022-01-14 LAB — RESP PANEL BY RT-PCR (RSV, FLU A&B, COVID)  RVPGX2
Influenza A by PCR: NEGATIVE
Influenza B by PCR: POSITIVE — AB
Resp Syncytial Virus by PCR: NEGATIVE
SARS Coronavirus 2 by RT PCR: NEGATIVE

## 2022-01-14 NOTE — Discharge Instructions (Signed)
Use acetaminophen 650 mg every 4 hours and ibuprofen 400-600 mg every 6 hours as needed for fever and body aches. Return if any difficulty breathing or other worsening symptoms.

## 2022-01-14 NOTE — ED Triage Notes (Signed)
Reports generalized body aches, nonproductive cough and feeling tired x 2 days. Fever of 102 at home yesterday.

## 2022-01-15 NOTE — ED Provider Notes (Signed)
[facility]-EMERGENCY DEPT Provider Note   CSN: [id number] Arrival date & time: 01/14/22  0557     History  Chief Complaint  Patient presents with   Generalized Body Aches    Karen Ashley is a 36 y.o. female.  HPI 36 year old female presents today complaining of fever, chills, nasal congestion, generalized malaise and bodyaches She has been taking p.o. without difficulty.     Home Medications Prior to Admission medications   Medication Sig Start Date End Date Taking? Authorizing Provider  acetaZOLAMIDE (DIAMOX) 125 MG tablet Take 1 tablet (125 mg total) by mouth 2 (two) times daily. Patient not taking: Reported on 02/26/2020 06/08/14   Rolland Porter, MD  amLODipine (NORVASC) 5 MG tablet Take 5 mg by mouth daily. Patient not taking: Reported on 02/26/2020    [provider]  Aspirin-Acetaminophen-Caffeine (GOODYS EXTRA STRENGTH PO) Take 1 packet by mouth daily as needed (headache).    [provider]  atenolol-chlorthalidone (TENORETIC) 50-25 MG tablet TAKE 1 TABLET BY MOUTH ONCE DAILY FOR 30 DAYS 04/29/19   [provider]  cephALEXin (KEFLEX) 500 MG capsule Take 1 capsule (500 mg total) by mouth 4 (four) times daily. 12/04/21   Cathren Laine, MD  cetirizine (ZYRTEC) 10 MG tablet cetirizine 10 mg tablet  TAKE 1 TABLET (10 MG TOTAL) BY MOUTH DAILY.    [provider]  etonogestrel (NEXPLANON) 68 MG IMPL implant Nexplanon 68 mg subdermal implant Patient not taking: Reported on 02/26/2020    [provider]  fluticasone (FLONASE) 50 MCG/ACT nasal spray fluticasone propionate 50 mcg/actuation nasal spray,suspension  INHALE 1 SPRAY(S) TWICE A DAY BY INTRANASAL ROUTE FOR 30 DAYS.    [provider]  HYDROcodone-acetaminophen (NORCO/VICODIN) 5-325 MG per tablet Take 1-2 tablets every 6 hours as needed for severe pain Patient not taking: Reported on 02/26/2020 06/11/14   Renne Crigler, PA-C  mupirocin ointment  (BACTROBAN) 2 % mupirocin 2 % topical ointment  1 APPLICATION TO AFFECTED AREA SMALL AMOUNT TWICE A DAY EXTERNALLY Patient not taking: Reported on 02/26/2020    [provider]  naproxen (NAPROSYN) 500 MG tablet Take 1 tablet (500 mg total) by mouth 2 (two) times daily. Patient not taking: Reported on 02/26/2020 06/01/18   Ward, Chase Picket, PA-C  norethindrone (MICRONOR) 0.35 MG tablet Take 1 tablet by mouth daily. Patient not taking: Reported on 02/26/2020 05/13/19   [provider]  phenazopyridine (PYRIDIUM) 200 MG tablet Take 1 tablet (200 mg total) by mouth 3 (three) times daily as needed for pain (bladder pain/spasm). 12/04/21   Cathren Laine, MD  triamterene-hydrochlorothiazide (DYAZIDE) 37.5-25 MG per capsule Take 1 capsule by mouth daily. Patient not taking: Reported on 02/26/2020    [provider]  VENTOLIN HFA 108 [phone] Base) MCG/ACT inhaler  02/05/19   [provider]      Allergies    Other, Peanut-containing drug products, and Peanuts [peanut oil]    Review of Systems   Review of Systems  Physical Exam Updated Vital Signs BP (!) 147/121   Pulse 89   Temp 98.9 F (37.2 C)   Resp 18   Ht 1.575 m (5\' 2" )   Wt 104.3 kg   SpO2 100%   BMI 42.07 kg/m  Physical Exam Vitals and nursing note reviewed.  Constitutional:      General: She is not in acute distress.    Appearance: She is well-developed. She is obese.  HENT:     Head: Normocephalic and atraumatic.  Right Ear: External ear normal.     Left Ear: External ear normal.     Nose: Nose normal.  Eyes:     Conjunctiva/sclera: Conjunctivae normal.     Pupils: Pupils are equal, round, and reactive to light.  Pulmonary:     Effort: Pulmonary effort is normal.  Musculoskeletal:        General: Normal range of motion.     Cervical back: Normal range of motion and neck supple.  Skin:    General: Skin is warm and dry.  Neurological:     Mental Status: She is alert and oriented to  person, place, and time.     Motor: No abnormal muscle tone.     Coordination: Coordination normal.  Psychiatric:        Behavior: Behavior normal.        Thought Content: Thought content normal.     ED Results / Procedures / Treatments   Labs (all labs ordered are listed, but only abnormal results are displayed) Labs Reviewed  RESP PANEL BY RT-PCR (RSV, FLU A&B, COVID)  RVPGX2 - Abnormal; Notable for the following components:      Result Value   Influenza B by PCR POSITIVE (*)    All other components within normal limits    EKG None  Radiology No results found.  Procedures Procedures    Medications Ordered in ED Medications - No data to display  ED Course/ Medical Decision Making/ A&P                           Medical Decision Making  36 year old female presents today with symptoms could Distant with viral infection.  She is flu positive. She does have a history of hypertension.  She has been taking p.o. reports not having her blood pressure medicine this morning.  She will have her blood pressure medication at home will have it rechecked as outpatient.        Final Clinical Impression(s) / ED Diagnoses Final diagnoses:  Influenza A    Rx / DC Orders ED Discharge Orders     None         Margarita Grizzle, MD 01/15/22 [phone]

## 2024-02-05 ENCOUNTER — Other Ambulatory Visit: Payer: Self-pay

## 2024-02-05 ENCOUNTER — Emergency Department (HOSPITAL_COMMUNITY)
Admission: EM | Admit: 2024-02-05 | Discharge: 2024-02-05 | Disposition: A | Discharge status: Home / Self Care | Attending: Emergency Medicine | Admitting: Emergency Medicine

## 2024-02-05 DIAGNOSIS — K644 Residual hemorrhoidal skin tags: Secondary | ICD-10-CM | POA: Insufficient documentation

## 2024-02-05 DIAGNOSIS — K649 Unspecified hemorrhoids: Secondary | ICD-10-CM | POA: Diagnosis present

## 2024-02-05 DIAGNOSIS — Z91010 Allergy to peanuts: Secondary | ICD-10-CM | POA: Diagnosis not present

## 2024-02-05 DIAGNOSIS — K5900 Constipation, unspecified: Secondary | ICD-10-CM | POA: Insufficient documentation

## 2024-02-05 MED ORDER — POLYETHYLENE GLYCOL 3350 17 G PO PACK: 17.0000 g | PACK | Freq: Two times a day (BID) | ORAL | 0 refills | Status: AC

## 2024-02-05 MED ORDER — LIDOCAINE 4 % EX CREA
TOPICAL_CREAM | Freq: Once | CUTANEOUS | Status: AC
  Filled 2024-02-05: qty 5

## 2024-02-05 MED ORDER — FLEET ENEMA RE ENEM
1.0000 | ENEMA | Freq: Once | RECTAL | Status: AC
  Administered 2024-02-05: 1 via RECTAL
  Filled 2024-02-05: qty 1

## 2024-02-05 MED ORDER — LIDOCAINE 5 % EX OINT: 1.0000 | TOPICAL_OINTMENT | CUTANEOUS | 0 refills | Status: AC | PRN

## 2024-02-05 MED ORDER — PROCTOFOAM HC 1-1 % EX FOAM: 1.0000 | Freq: Four times a day (QID) | CUTANEOUS | 0 refills | Status: AC

## 2024-02-05 NOTE — ED Triage Notes (Signed)
 Pt ambulatory to triage with complaints of an inflamed hemorrhoid which has caused her to be constipated. Pt reports no bowel movement for the past 2 weeks. PreparationH has not helped, pt also reports taking many stimulant laxatives without relief.

## 2024-02-05 NOTE — Discharge Instructions (Signed)
 Take the medications to help with your constipation apply the cream and ointment for pain discomfort.  Continue to do sitz bath's frequently to help with the pain and irritation.  Call the general surgery office for further treatment and evaluation of the symptoms do not solved by the end of the week

## 2024-02-05 NOTE — ED Provider Notes (Signed)
 " [place] EMERGENCY DEPARTMENT AT [facility] Provider Note   CSN: [id number] Arrival date & time: 02/05/24  1149     Patient presents with: Constipation and Hemorrhoids   Karen Ashley is a 39 y.o. female.  {Add pertinent medical, surgical, social history, OB history to YEP:67052}  Constipation    Patient presents ED for evaluation of hemorrhoid.  Patient states she has been having the symptoms for at least the last week or 2.  Initially it was rather mild.  Patient states she has not had a normal bowel movement in that period of time.  She has tried laxatives as well as Preparation H without much relief.  Patient denies any abdominal pain.  No vomiting.  The hemorrhoid is very tender.  She states she went to her OB/GYN doctor who suggested she come to the ED  Prior to Admission medications  Medication Sig Start Date End Date Taking? Authorizing Provider  acetaZOLAMIDE  (DIAMOX ) 125 MG tablet Take 1 tablet (125 mg total) by mouth 2 (two) times daily. Patient not taking: Reported on 02/26/2020 06/08/14   Lynwood Anes, MD  amLODipine  (NORVASC ) 5 MG tablet Take 5 mg by mouth daily. Patient not taking: Reported on 02/26/2020    [provider]  Aspirin-Acetaminophen -Caffeine (GOODYS EXTRA STRENGTH PO) Take 1 packet by mouth daily as needed (headache).    [provider]  atenolol-chlorthalidone (TENORETIC) 50-25 MG tablet TAKE 1 TABLET BY MOUTH ONCE DAILY FOR 30 DAYS 04/29/19   [provider]  cephALEXin  (KEFLEX ) 500 MG capsule Take 1 capsule (500 mg total) by mouth 4 (four) times daily. 12/04/21   Steinl, Kevin, MD  cetirizine  (ZYRTEC ) 10 MG tablet cetirizine  10 mg tablet  TAKE 1 TABLET (10 MG TOTAL) BY MOUTH DAILY.    [provider]  etonogestrel (NEXPLANON) 68 MG IMPL implant Nexplanon 68 mg subdermal implant Patient not taking: Reported on 02/26/2020    [provider]  fluticasone (FLONASE) 50 MCG/ACT nasal spray fluticasone  propionate 50 mcg/actuation nasal spray,suspension  INHALE 1 SPRAY(S) TWICE A DAY BY INTRANASAL ROUTE FOR 30 DAYS.    [provider]  HYDROcodone -acetaminophen  (NORCO/VICODIN) 5-325 MG per tablet Take 1-2 tablets every 6 hours as needed for severe pain Patient not taking: Reported on 02/26/2020 06/11/14   Geiple, Joshua, PA-C  mupirocin ointment (BACTROBAN) 2 % mupirocin 2 % topical ointment  1 APPLICATION TO AFFECTED AREA SMALL AMOUNT TWICE A DAY EXTERNALLY Patient not taking: Reported on 02/26/2020    [provider]  naproxen  (NAPROSYN ) 500 MG tablet Take 1 tablet (500 mg total) by mouth 2 (two) times daily. Patient not taking: Reported on 02/26/2020 06/01/18   Ward, Jaime Pilcher, PA-C  norethindrone (MICRONOR) 0.35 MG tablet Take 1 tablet by mouth daily. Patient not taking: Reported on 02/26/2020 05/13/19   [provider]  phenazopyridine  (PYRIDIUM ) 200 MG tablet Take 1 tablet (200 mg total) by mouth 3 (three) times daily as needed for pain (bladder pain/spasm). 12/04/21   Bernard Drivers, MD  triamterene-hydrochlorothiazide (DYAZIDE) 37.5-25 MG per capsule Take 1 capsule by mouth daily. Patient not taking: Reported on 02/26/2020    [provider]  VENTOLIN HFA 108 (90 Base) MCG/ACT inhaler  02/05/19   [provider]    Allergies: Other, Peanut-containing drug products, and Peanuts [peanut oil]    Review of Systems  Gastrointestinal:  Positive for constipation.    Updated Vital Signs BP (!) 147/109 (BP Location: Left Arm)   Pulse 77  Temp 98 F (36.7 C) (Oral)   Resp 14   LMP 01/11/2024 (Exact Date)   SpO2 100%   Physical Exam Vitals and nursing note reviewed. Exam conducted with a chaperone present.  Constitutional:      General: She is not in acute distress.    Appearance: She is well-developed.  HENT:     Head: Normocephalic and atraumatic.     Right Ear: External ear normal.     Left Ear: External ear normal.  Eyes:      General: No scleral icterus.       Right eye: No discharge.        Left eye: No discharge.     Conjunctiva/sclera: Conjunctivae normal.  Neck:     Trachea: No tracheal deviation.  Cardiovascular:     Rate and Rhythm: Normal rate.  Pulmonary:     Effort: Pulmonary effort is normal. No respiratory distress.     Breath sounds: No stridor.  Abdominal:     General: There is no distension.     Palpations: There is no mass.     Tenderness: There is no abdominal tenderness. There is no guarding.  Genitourinary:    Rectum: Tenderness and external hemorrhoid present.     Comments: External hemorrhoid noted, approximately 1 cm in size, tender, no thrombosis, rectal exam without signs of fecal impaction Musculoskeletal:        General: No swelling or deformity.     Cervical back: Neck supple.  Skin:    General: Skin is warm and dry.     Findings: No rash.  Neurological:     Mental Status: She is alert. Mental status is at baseline.     Cranial Nerves: No dysarthria or facial asymmetry.     Motor: No seizure activity.     (all labs ordered are listed, but only abnormal results are displayed) Labs Reviewed - No data to display  EKG: None  Radiology: No results found.  {Document cardiac monitor, telemetry assessment procedure when appropriate:32947} Procedures   Medications Ordered in the ED  lidocaine  (LMX) 4 % cream (has no administration in time range)  sodium phosphate (FLEET) enema 1 enema (has no administration in time range)      {Click here for ABCD2, HEART and other calculators REFRESH Note before signing:1}                              Medical Decision Making Risk OTC drugs.   ***  {Document critical care time when appropriate  Document review of labs and clinical decision tools ie CHADS2VASC2, etc  Document your independent review of radiology images and any outside records  Document your discussion with family members, caretakers and with consultants   Document social determinants of health affecting pt's care  Document your decision making why or why not admission, treatments were needed:32947:::1}   Final diagnoses:  None    ED Discharge Orders     None        "
